# Patient Record
Sex: Male | Born: 2000 | Race: White | Hispanic: Yes | Marital: Single | Smoking: Never smoker
Health system: Southern US, Community
[De-identification: ages and names within clinical notes are randomized; demographics above are authoritative.]

---
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
## 2021-01-14 ENCOUNTER — Other Ambulatory Visit: Payer: Self-pay

## 2021-01-14 ENCOUNTER — Emergency Department (HOSPITAL_COMMUNITY)
Admission: EM | Admit: 2021-01-14 | Discharge: 2021-01-15 | Disposition: A | Discharge status: Home / Self Care | Payer: Self-pay | Attending: Emergency Medicine | Admitting: Emergency Medicine

## 2021-01-14 ENCOUNTER — Encounter (HOSPITAL_COMMUNITY): Payer: Self-pay | Admitting: Emergency Medicine

## 2021-01-14 ENCOUNTER — Emergency Department (HOSPITAL_COMMUNITY): Payer: Self-pay

## 2021-01-14 DIAGNOSIS — R109 Unspecified abdominal pain: Secondary | ICD-10-CM | POA: Insufficient documentation

## 2021-01-14 LAB — CBC WITH DIFFERENTIAL/PLATELET
Abs Immature Granulocytes: 0.02 10*3/uL (ref 0.00–0.07)
Basophils Absolute: 0 10*3/uL (ref 0.0–0.1)
Basophils Relative: 1 %
Eosinophils Absolute: 0.1 10*3/uL (ref 0.0–0.5)
Eosinophils Relative: 1 %
HCT: 49.3 % (ref 39.0–52.0)
Hemoglobin: 16.2 g/dL (ref 13.0–17.0)
Immature Granulocytes: 0 %
Lymphocytes Relative: 38 %
Lymphs Abs: 2.5 10*3/uL (ref 0.7–4.0)
MCH: 30.5 pg (ref 26.0–34.0)
MCHC: 32.9 g/dL (ref 30.0–36.0)
MCV: 92.8 fL (ref 80.0–100.0)
Monocytes Absolute: 0.5 10*3/uL (ref 0.1–1.0)
Monocytes Relative: 7 %
Neutro Abs: 3.5 10*3/uL (ref 1.7–7.7)
Neutrophils Relative %: 53 %
Platelets: 274 10*3/uL (ref 150–400)
RBC: 5.31 MIL/uL (ref 4.22–5.81)
RDW: 12.6 % (ref 11.5–15.5)
WBC: 6.6 10*3/uL (ref 4.0–10.5)
nRBC: 0 % (ref 0.0–0.2)

## 2021-01-14 LAB — BASIC METABOLIC PANEL
Anion gap: 8 (ref 5–15)
BUN: 11 mg/dL (ref 6–20)
CO2: 25 mmol/L (ref 22–32)
Calcium: 9.1 mg/dL (ref 8.9–10.3)
Chloride: 104 mmol/L (ref 98–111)
Creatinine, Ser: 1.1 mg/dL (ref 0.61–1.24)
GFR, Estimated: >60 mL/min (ref >60)
Glucose, Bld: 117 mg/dL — ABNORMAL HIGH (ref 70–99)
Potassium: 4 mmol/L (ref 3.5–5.1)
Sodium: 137 mmol/L (ref 135–145)

## 2021-01-14 NOTE — ED Provider Notes (Signed)
Emergency Medicine Provider Triage Evaluation Note  Kyle Brock , a 20 y.o. male  was evaluated in triage.  Pt complains of left flank pain.  Patient complains of left flank pain for the last few days.  This pain is intermittent.  Patient denies any alleviating or aggravating factors.  Patient denies any recent falls or injuries.  Review of Systems  Positive: Left flank pain Negative: Fever, chills, dysuria, hematuria, urinary frequency, abdominal pain, nausea, vomiting, testicular pain or swelling.  Physical Exam  BP (!) 146/103   Pulse 84   Temp 98.7 F (37.1 C) (Oral)   Resp 16   SpO2 97%  Gen:   Awake, no distress   Resp:  Normal effort  MSK:   Moves extremities without difficulty  Other:  No midline tenderness or deformity to cervical, thoracic, or lumbar spine.  Abdomen soft, nondistended, nontender.  Negative CVA tenderness bilaterally.  Medical Decision Making  Medically screening exam initiated at 4:44 PM.  Appropriate orders placed.  Jessup Ogas was informed that the remainder of the evaluation will be completed by another provider, this initial triage assessment does not replace that evaluation, and the importance of remaining in the ED until their evaluation is complete.  Will obtain urinalysis, urine culture, basic lab work.  Shared decision making with patient about obtaining noncontrast CT scan to evaluate for renal calculus or waiting for UA results, patient elects to have scan performed now.   Haskel Schroeder, PA-C 01/14/21 1646    Jacalyn Lefevre, MD 01/14/21 [phone]

## 2021-01-14 NOTE — ED Triage Notes (Signed)
Pt reports L flank pain x 1 week.  Denies nausea, vomiting, fever, chills, and urinary complaints.

## 2021-01-15 LAB — URINALYSIS, ROUTINE W REFLEX MICROSCOPIC
Bilirubin Urine: NEGATIVE
Glucose, UA: NEGATIVE mg/dL
Hgb urine dipstick: NEGATIVE
Ketones, ur: NEGATIVE mg/dL
Leukocytes,Ua: NEGATIVE
Nitrite: NEGATIVE
Protein, ur: NEGATIVE mg/dL
Specific Gravity, Urine: 1.024 (ref 1.005–1.030)
pH: 6 (ref 5.0–8.0)

## 2021-01-15 MED ORDER — OMEPRAZOLE 20 MG PO CPDR: 20.0000 mg | DELAYED_RELEASE_CAPSULE | Freq: Every day | ORAL | 0 refills | Status: DC

## 2021-01-15 NOTE — ED Provider Notes (Signed)
[facility] EMERGENCY DEPARTMENT Provider Note   CSN: [id number] Arrival date & time: 01/14/21  1623     History No chief complaint on file.   Kyle Brock is a 20 y.o. male.  Patient presents chief complaint of left upper quadrant pain.  Describes it as sharp and persistent worse in the last week.  He states the pain is worse especially when he eats something.  Otherwise denies any fevers or cough.  No vomiting or diarrhea.  No pain with urination or blood noticed in the urine.  Denies any fall or trauma.      History reviewed. No pertinent past medical history.  Patient Active Problem List   Diagnosis Date Noted   ECZEMA 12/28/2009   SKIN RASH 12/28/2009   HEART SOUNDS, ABNORMAL 12/28/2009    History reviewed. No pertinent surgical history.     No family history on file.  Social History   Tobacco Use   Smoking status: Never   Smokeless tobacco: Never  Substance Use Topics   Alcohol use: No   Drug use: Not Currently    Home Medications Prior to Admission medications   Medication Sig Start Date End Date Taking? Authorizing Provider  omeprazole (PRILOSEC) 20 MG capsule Take 1 capsule (20 mg total) by mouth daily for 20 days. 01/15/21 02/04/21 Yes Medford Staheli, Eustace Moore, MD  bismuth subsalicylate (PEPTO BISMOL) 262 MG/15ML suspension Take 15 mLs by mouth every 6 (six) hours as needed for indigestion.    [provider]  dicyclomine (BENTYL) 10 MG capsule Take 1 capsule (10 mg total) by mouth 2 (two) times daily as needed. For abdominal cramping 07/14/12   Ree Shay, MD  ibuprofen (ADVIL,MOTRIN) 100 MG/5ML suspension Take 29.4 mLs (588 mg total) by mouth every 6 (six) hours as needed for fever or mild pain. 11/28/13   Marcellina Millin, MD  ondansetron (ZOFRAN ODT) 4 MG disintegrating tablet Take 1 tablet (4 mg total) by mouth every 8 (eight) hours as needed for nausea. 07/14/12   Ree Shay, MD  ondansetron (ZOFRAN ODT) 4 MG disintegrating tablet  Take 1 tablet (4 mg total) by mouth every 8 (eight) hours as needed for nausea or vomiting. 05/15/16   Deatra Canter, FNP  ondansetron (ZOFRAN) 4 MG tablet Take 1 tablet (4 mg total) by mouth every 6 (six) hours. Prn n/v. 07/11/15   Linna Hoff, MD  predniSONE (STERAPRED UNI-PAK 21 TAB) 10 MG (21) TBPK tablet Take by mouth daily. Take 6 tabs by mouth daily  for 2 days, then 5 tabs for 2 days, then 4 tabs for 2 days, then 3 tabs for 2 days, 2 tabs for 2 days, then 1 tab by mouth daily for 2 days 02/18/17   Alysia Penna C, PA-C    Allergies    Patient has no known allergies.  Review of Systems   Review of Systems  Constitutional:  Negative for fever.  HENT:  Negative for ear pain and sore throat.   Eyes:  Negative for pain.  Respiratory:  Negative for cough.   Cardiovascular:  Negative for chest pain.  Gastrointestinal:  Negative for abdominal pain.  Genitourinary:  Positive for flank pain.  Musculoskeletal:  Negative for back pain.  Skin:  Negative for color change and rash.  Neurological:  Negative for syncope.  All other systems reviewed and are negative.  Physical Exam Updated Vital Signs BP (!) 142/99   Pulse 71   Temp 98.7 F (37.1 C) (Oral)  Resp 17   SpO2 99%   Physical Exam Constitutional:      Appearance: He is well-developed.  HENT:     Head: Normocephalic.     Nose: Nose normal.  Eyes:     Extraocular Movements: Extraocular movements intact.  Cardiovascular:     Rate and Rhythm: Normal rate.  Pulmonary:     Effort: Pulmonary effort is normal.  Abdominal:     Tenderness: There is no abdominal tenderness. There is no guarding or rebound.  Skin:    Coloration: Skin is not jaundiced.  Neurological:     Mental Status: He is alert. Mental status is at baseline.    ED Results / Procedures / Treatments   Labs (all labs ordered are listed, but only abnormal results are displayed) Labs Reviewed  BASIC METABOLIC PANEL - Abnormal; Notable for the following  components:      Result Value   Glucose, Bld 117 (*)    All other components within normal limits  URINE CULTURE  URINALYSIS, ROUTINE W REFLEX MICROSCOPIC  CBC WITH DIFFERENTIAL/PLATELET    EKG None  Radiology CT Renal Stone Study  Result Date: 01/14/2021 CLINICAL DATA:  Left flank pain. Patient reports symptoms for 1 week. EXAM: CT ABDOMEN AND PELVIS WITHOUT CONTRAST TECHNIQUE: Multidetector CT imaging of the abdomen and pelvis was performed following the standard protocol without IV contrast. COMPARISON:  None. FINDINGS: Lower chest: Minor subsegmental atelectasis in the right lower lobe. No confluent consolidation or pleural effusion. Hepatobiliary: Diffusely decreased hepatic density typical of steatosis. There is focal fatty sparing adjacent to the gallbladder fossa. No focal lesion is seen on this unenhanced exam. Partially distended gallbladder without calcified gallstone or pericholecystic inflammation. There is no biliary dilatation. Pancreas: Unremarkable. No pancreatic ductal dilatation or surrounding inflammatory changes. Spleen: Normal in size without focal abnormality. Adrenals/Urinary Tract: Normal adrenal glands. No hydronephrosis or renal calculi. No perinephric edema. 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 No ureteral or bladder stones. No focal renal lesions are seen. No bladder wall thickening. Stomach/Bowel: Ingested material within the stomach. No small bowel obstruction or inflammation. Normal appendix. Small volume of stool throughout the colon. No colonic inflammation. Vascular/Lymphatic: Normal caliber abdominal aorta. No portal venous or mesenteric gas. There are no enlarged lymph nodes in the abdomen or pelvis. Reproductive: Prostate is unremarkable. Other: No free air, free fluid, or intra-abdominal fluid collection. There is a small fat containing umbilical hernia. Musculoskeletal: There are no acute or suspicious  osseous abnormalities. Vague area of sclerosis in the left femoral head may represent avascular necrosis, only faintly visualized. IMPRESSION: 1. No renal stones or obstructive uropathy. No acute abnormality in the abdomen/pelvis. 2. Hepatic steatosis. 3. Small fat containing umbilical hernia. 4. Vague area of sclerosis in the left femoral head may represent avascular necrosis, only faintly visualized. Electronically Signed   By: Narda Rutherford M.D.   On: 01/14/2021 17:46    Procedures Procedures   Medications Ordered in ED Medications - No data to display  ED Course  I have reviewed the triage vital signs and the nursing notes.  Pertinent labs & imaging results that were available during my care of the patient were reviewed by me and considered in my medical decision making (see chart for details).    MDM Rules/Calculators/A&P                           Labs unremarkable white  count normal hemoglobin normal chemistry remarkable.  CT of the abdomen pelvis pursued with no abnormalities noted per radiologist.  Urinalysis unremarkable as well.  Serial abdominal exams are done, last was on time 9:20 AM, no guarding or tenderness noted.  On repeat evaluation patient states pain is resolved he no longer has any pain or discomfort and is feeling well.  Etiology is unclear, gastritis is considered, however no abnormal findings seen today.  Advising outpatient follow-up with his doctor within the week, advised immediate return for worsening pain fevers or any additional concerns.   Final Clinical Impression(s) / ED Diagnoses Final diagnoses:  Flank pain    Rx / DC Orders ED Discharge Orders          Ordered    omeprazole (PRILOSEC) 20 MG capsule  Daily        01/15/21 0931             Cheryll Cockayne, MD 01/15/21 [phone]

## 2021-01-15 NOTE — Discharge Instructions (Addendum)
Your blood test and CT imaging did not show any emergent findings.  Your urinalysis did not show any signs of infection.  Take the medications as written, follow-up with your doctor within the week.  Return to the ER if you have fevers or worsening symptoms

## 2021-01-16 LAB — URINE CULTURE: Culture: NO GROWTH

## 2021-02-09 ENCOUNTER — Other Ambulatory Visit: Payer: Self-pay

## 2021-02-09 ENCOUNTER — Encounter: Payer: Self-pay | Admitting: Family

## 2021-02-09 ENCOUNTER — Ambulatory Visit (INDEPENDENT_AMBULATORY_CARE_PROVIDER_SITE_OTHER): Payer: Self-pay | Admitting: Family

## 2021-02-09 VITALS — BP 130/88 | HR 80 | Temp 98.8°F | Resp 16 | Ht 68.5 in | Wt 214.8 lb

## 2021-02-09 DIAGNOSIS — K30 Functional dyspepsia: Secondary | ICD-10-CM

## 2021-02-09 DIAGNOSIS — Z7689 Persons encountering health services in other specified circumstances: Secondary | ICD-10-CM | POA: Insufficient documentation

## 2021-02-09 DIAGNOSIS — E669 Obesity, unspecified: Secondary | ICD-10-CM

## 2021-02-09 MED ORDER — OMEPRAZOLE 20 MG PO CPDR: 20.0000 mg | DELAYED_RELEASE_CAPSULE | Freq: Every day | ORAL | 0 refills | Status: DC

## 2021-02-09 NOTE — Progress Notes (Signed)
New Patient Office Visit  Subjective:  Patient ID: Kyle Brock, male    DOB: [birth year]  Age: 20 y.o. MRN: [id number]  CC:  Chief Complaint  Patient presents with   Establish Care    HPI Kyle Brock presents to establish care and discuss one problem.  GERD, Follow up: The patient was last seen for GERD 1 months ago in the ER for initial DX. Since that visit he was given Prilosec, and has been taking prn. He reports fair compliance with treatment. He is not having side effects. He IS experiencing fullness after meals, heartburn, midespigastric pain, nausea, symptoms primarily relate to meals, and lying down after meals, and upper abdominal discomfort. He is NOT experiencing bilious reflux, chest pain, cough, difficulty swallowing, hematemesis, or hoarseness  No past medical history on file.  No past surgical history on file.  Family History  Problem Relation Age of Onset   Kidney disease Father     Social History   Socioeconomic History   Marital status: Single    Spouse name: Not on file   Number of children: Not on file   Years of education: Not on file   Highest education level: Not on file  Occupational History   Not on file  Tobacco Use   Smoking status: Never   Smokeless tobacco: Never  Substance and Sexual Activity   Alcohol use: No   Drug use: Not Currently   Sexual activity: Not on file  Other Topics Concern   Not on file  Social History Narrative   Not on file   Social Determinants of Health   Financial Resource Strain: Not on file  Food Insecurity: Not on file  Transportation Needs: Not on file  Physical Activity: Not on file  Stress: Not on file  Social Connections: Not on file  Intimate Partner Violence: Not on file    Objective:   Today's Vitals: BP 130/88   Pulse 80   Temp 98.8 F (37.1 C)   Resp 16   Ht 5' 8.5" (1.74 m)   Wt 214 lb 12.8 oz (97.4 kg)   SpO2 97%   BMI 32.19 kg/m   Physical Exam Vitals and nursing note  reviewed.  Constitutional:      General: He is not in acute distress.    Appearance: Normal appearance. He is obese.  HENT:     Head: Normocephalic.  Cardiovascular:     Rate and Rhythm: Normal rate and regular rhythm.  Pulmonary:     Effort: Pulmonary effort is normal.     Breath sounds: Normal breath sounds.  Musculoskeletal:        General: Normal range of motion.     Cervical back: Normal range of motion.  Skin:    General: Skin is warm and dry.  Neurological:     Mental Status: He is alert and oriented to person, place, and time.  Psychiatric:        Mood and Affect: Mood normal.    Assessment & Plan:   Problem List Items Addressed This Visit       Other   Obesity (BMI 30-39.9) - Primary    Wt. Loss strategies reviewed including portion control, less carbs including sweets, eating most of calories earlier in day, drinking 64oz water qd, and establishing daily exercise routine.      Persistent indigestion    Seen in ER last month for same problem and given Prilosec, has been taking prn, advised to take daily, sending  new RX. Advised to avoid acidic foods.       Encounter to establish care    Outpatient Encounter Medications as of 02/09/2021  Medication Sig   omeprazole (PRILOSEC) 20 MG capsule Take 1 capsule (20 mg total) by mouth daily. Take daily for 2 weeks, then every other day.   [DISCONTINUED] bismuth subsalicylate (PEPTO BISMOL) 262 MG/15ML suspension Take 15 mLs by mouth every 6 (six) hours as needed for indigestion.   [DISCONTINUED] dicyclomine (BENTYL) 10 MG capsule Take 1 capsule (10 mg total) by mouth 2 (two) times daily as needed. For abdominal cramping   [DISCONTINUED] ibuprofen (ADVIL,MOTRIN) 100 MG/5ML suspension Take 29.4 mLs (588 mg total) by mouth every 6 (six) hours as needed for fever or mild pain.   [DISCONTINUED] omeprazole (PRILOSEC) 20 MG capsule Take 1 capsule (20 mg total) by mouth daily for 20 days.   [DISCONTINUED] ondansetron (ZOFRAN  ODT) 4 MG disintegrating tablet Take 1 tablet (4 mg total) by mouth every 8 (eight) hours as needed for nausea.   [DISCONTINUED] ondansetron (ZOFRAN ODT) 4 MG disintegrating tablet Take 1 tablet (4 mg total) by mouth every 8 (eight) hours as needed for nausea or vomiting.   [DISCONTINUED] ondansetron (ZOFRAN) 4 MG tablet Take 1 tablet (4 mg total) by mouth every 6 (six) hours. Prn n/v.   [DISCONTINUED] predniSONE (STERAPRED UNI-PAK 21 TAB) 10 MG (21) TBPK tablet Take by mouth daily. Take 6 tabs by mouth daily  for 2 days, then 5 tabs for 2 days, then 4 tabs for 2 days, then 3 tabs for 2 days, 2 tabs for 2 days, then 1 tab by mouth daily for 2 days   No facility-administered encounter medications on file as of 02/09/2021.    Follow-up: Return if symptoms worsen or fail to improve, for Complete physical w/fasting labs.   Dulce Sellar, NP

## 2021-02-09 NOTE — Assessment & Plan Note (Signed)
Wt. Loss strategies reviewed including portion control, less carbs including sweets, eating most of calories earlier in day, drinking 64oz water qd, and establishing daily exercise routine. °

## 2021-02-09 NOTE — Assessment & Plan Note (Signed)
Seen in ER last month for same problem and given Prilosec, has been taking prn, advised to take daily, sending new RX. Advised to avoid acidic foods.

## 2021-02-09 NOTE — Patient Instructions (Addendum)
Welcome to [facility]! It was a pleasure meeting you today.  As discussed, I have sent a refill on your medication to your pharmacy, remember to take this daily for the next 2 weeks, then stop or take every other day to control symptoms. If you still have symptoms you can take Pepto Bismol, Tums, or Rolaids as needed. See the attached handout to foods & drinks to avoid to prevent symptoms. Let me know if symptoms persist or worsen.  Please schedule a physical with fasting lab work today for the near future!  PLEASE NOTE:  If you had any LAB tests please let us know if you have not heard back within a few days. You may see your results on MyChart before we have a chance to review them but we will give you a call once they are reviewed by [place]. If we ordered any REFERRALS today, please let us know if you have not heard from their office within the next week.  Let us know through MyChart if you are needing REFILLS, or have your pharmacy send [place] the request. You can also use MyChart to communicate with me or any office staff.  Please try these tips to maintain a healthy lifestyle:  Eat most of your calories during the day when you are active. Eliminate processed foods including packaged sweets (pies, cakes, cookies), reduce intake of potatoes, white bread, white pasta, and white rice. Look for whole grain options, oat flour or almond flour.  Each meal should contain half fruits/vegetables, one quarter protein, and one quarter carbs (no bigger than a computer mouse).  Cut down on sweet beverages. This includes juice, soda, and sweet tea. Also watch fruit intake, though this is a healthier sweet option, it still contains natural sugar! Limit to 3 servings daily.  Drink at least 1 glass of water with each meal and aim for at least 8 glasses per day  Exercise at least 150 minutes every week.

## 2021-03-16 ENCOUNTER — Emergency Department (HOSPITAL_COMMUNITY)
Admission: EM | Admit: 2021-03-16 | Discharge: 2021-03-17 | Disposition: A | Discharge status: Home / Self Care | Payer: Self-pay | Attending: Emergency Medicine | Admitting: Emergency Medicine

## 2021-03-16 ENCOUNTER — Other Ambulatory Visit: Payer: Self-pay

## 2021-03-16 ENCOUNTER — Emergency Department (HOSPITAL_COMMUNITY): Payer: Self-pay

## 2021-03-16 DIAGNOSIS — J101 Influenza due to other identified influenza virus with other respiratory manifestations: Secondary | ICD-10-CM | POA: Insufficient documentation

## 2021-03-16 DIAGNOSIS — Z20822 Contact with and (suspected) exposure to covid-19: Secondary | ICD-10-CM | POA: Insufficient documentation

## 2021-03-16 DIAGNOSIS — R002 Palpitations: Secondary | ICD-10-CM | POA: Insufficient documentation

## 2021-03-16 LAB — CBC WITH DIFFERENTIAL/PLATELET
Abs Immature Granulocytes: 0.02 10*3/uL (ref 0.00–0.07)
Basophils Absolute: 0 10*3/uL (ref 0.0–0.1)
Basophils Relative: 0 %
Eosinophils Absolute: 0 10*3/uL (ref 0.0–0.5)
Eosinophils Relative: 0 %
HCT: 48.1 % (ref 39.0–52.0)
Hemoglobin: 16.7 g/dL (ref 13.0–17.0)
Immature Granulocytes: 0 %
Lymphocytes Relative: 7 %
Lymphs Abs: 0.6 10*3/uL — ABNORMAL LOW (ref 0.7–4.0)
MCH: 31.4 pg (ref 26.0–34.0)
MCHC: 34.7 g/dL (ref 30.0–36.0)
MCV: 90.4 fL (ref 80.0–100.0)
Monocytes Absolute: 1 10*3/uL (ref 0.1–1.0)
Monocytes Relative: 11 %
Neutro Abs: 7.2 10*3/uL (ref 1.7–7.7)
Neutrophils Relative %: 82 %
Platelets: 248 10*3/uL (ref 150–400)
RBC: 5.32 MIL/uL (ref 4.22–5.81)
RDW: 12.5 % (ref 11.5–15.5)
WBC: 8.9 10*3/uL (ref 4.0–10.5)
nRBC: 0 % (ref 0.0–0.2)

## 2021-03-16 LAB — BASIC METABOLIC PANEL
Anion gap: 10 (ref 5–15)
BUN: 10 mg/dL (ref 6–20)
CO2: 21 mmol/L — ABNORMAL LOW (ref 22–32)
Calcium: 8.9 mg/dL (ref 8.9–10.3)
Chloride: 106 mmol/L (ref 98–111)
Creatinine, Ser: 1.06 mg/dL (ref 0.61–1.24)
GFR, Estimated: >60 mL/min (ref >60)
Glucose, Bld: 113 mg/dL — ABNORMAL HIGH (ref 70–99)
Potassium: 4.3 mmol/L (ref 3.5–5.1)
Sodium: 137 mmol/L (ref 135–145)

## 2021-03-16 LAB — RESP PANEL BY RT-PCR (FLU A&B, COVID) ARPGX2
Influenza A by PCR: POSITIVE — AB
Influenza B by PCR: NEGATIVE
SARS Coronavirus 2 by RT PCR: NEGATIVE

## 2021-03-16 MED ORDER — OSELTAMIVIR PHOSPHATE 75 MG PO CAPS: 75.0000 mg | ORAL_CAPSULE | Freq: Two times a day (BID) | ORAL | 0 refills | Status: DC

## 2021-03-16 MED ORDER — LACTATED RINGERS IV BOLUS
1000.0000 mL | Freq: Once | INTRAVENOUS | Status: AC
  Administered 2021-03-16: 22:00:00 1000 mL via INTRAVENOUS

## 2021-03-16 MED ORDER — METOPROLOL TARTRATE 5 MG/5ML IV SOLN
5.0000 mg | Freq: Once | INTRAVENOUS | Status: AC
  Administered 2021-03-16: 22:00:00 5 mg via INTRAVENOUS
  Filled 2021-03-16: qty 5

## 2021-03-16 MED ORDER — ACETAMINOPHEN 500 MG PO TABS
1000.0000 mg | ORAL_TABLET | Freq: Once | ORAL | Status: AC
  Administered 2021-03-16: 22:00:00 1000 mg via ORAL
  Filled 2021-03-16: qty 2

## 2021-03-16 MED ORDER — IBUPROFEN 400 MG PO TABS
600.0000 mg | ORAL_TABLET | Freq: Once | ORAL | Status: AC
  Administered 2021-03-16: 21:00:00 600 mg via ORAL
  Filled 2021-03-16: qty 1

## 2021-03-16 MED ORDER — LACTATED RINGERS IV BOLUS
1000.0000 mL | Freq: Once | INTRAVENOUS | Status: AC
  Administered 2021-03-16: 21:00:00 1000 mL via INTRAVENOUS

## 2021-03-16 NOTE — Discharge Instructions (Addendum)
Please follow-up with your primary care doctor.  Can also follow-up with cardiology.  If you have any recurrent chest pain, palpitations or difficulty in breathing, please return to ER for reassessment.  Recommend taking course of Tamiflu.

## 2021-03-16 NOTE — ED Provider Notes (Signed)
[facility] EMERGENCY DEPARTMENT Provider Note   CSN: [id number] Arrival date & time: 03/16/21  2036     History Chief Complaint  Patient presents with   Palpitations    Kyle Brock is a 20 y.o. male.  Presents to ER with concern for palpitations.  Palpitations started a couple hours prior to arrival.  Had initially endorsed some chest discomfort, no difficulty in breathing.  He also has had fever today, chills and feeling generally unwell, cough.  Cough is nonproductive.  Contacted EMS, patient was thought to possibly have SVT and was given adenosine.  Patient states that he currently does not have any chest pain.  Continues to deny any difficulty in breathing.  He denies any family history of sudden cardiac death, fatal arrhythmia.  He denies any chronic medical problems.  HPI     Past Medical History:  Diagnosis Date   HEART SOUNDS, ABNORMAL 12/28/2009   Qualifier: Diagnosis of  By: Annamary Carolin MD, Amber     SKIN RASH 12/28/2009   Qualifier: Diagnosis of  By: [place], Bonnie      Patient Active Problem List   Diagnosis Date Noted   Obesity (BMI 30-39.9) 02/09/2021   Persistent indigestion 02/09/2021   Encounter to establish care 02/09/2021   ECZEMA 12/28/2009    No past surgical history on file.     Family History  Problem Relation Age of Onset   Kidney disease Father     Social History   Tobacco Use   Smoking status: Never   Smokeless tobacco: Never  Substance Use Topics   Alcohol use: No   Drug use: Not Currently    Home Medications Prior to Admission medications   Medication Sig Start Date End Date Taking? Authorizing Provider  oseltamivir (TAMIFLU) 75 MG capsule Take 1 capsule (75 mg total) by mouth every 12 (twelve) hours. 03/16/21  Yes Lucrezia Starch, MD  omeprazole (PRILOSEC) 20 MG capsule Take 1 capsule (20 mg total) by mouth daily. Take daily for 2 weeks, then every other day. Patient not taking: Reported on 03/16/2021  02/09/21   Jeanie Sewer, NP    Allergies    Patient has no known allergies.  Review of Systems   Review of Systems  Constitutional:  Positive for chills, fatigue and fever.  HENT:  Negative for ear pain and sore throat.   Eyes:  Negative for pain and visual disturbance.  Respiratory:  Negative for cough and shortness of breath.   Cardiovascular:  Negative for chest pain and palpitations.  Gastrointestinal:  Negative for abdominal pain and vomiting.  Genitourinary:  Negative for dysuria and hematuria.  Musculoskeletal:  Positive for arthralgias and myalgias. Negative for back pain.  Skin:  Negative for color change and rash.  Neurological:  Negative for seizures and syncope.  All other systems reviewed and are negative.  Physical Exam Updated Vital Signs BP (!) 131/96    Pulse (!) 114    Temp 100 F (37.8 C) (Oral)    Resp 17    Ht 5\' 8"  (1.727 m)    Wt 94 kg    SpO2 99%    BMI 31.51 kg/m   Physical Exam Vitals and nursing note reviewed.  Constitutional:      General: He is not in acute distress.    Appearance: He is well-developed.  HENT:     Head: Normocephalic and atraumatic.  Eyes:     Conjunctiva/sclera: Conjunctivae normal.  Cardiovascular:     Rate and Rhythm:  Regular rhythm. Tachycardia present.     Pulses: Normal pulses.  Pulmonary:     Effort: Pulmonary effort is normal. No respiratory distress.     Breath sounds: Normal breath sounds.  Abdominal:     Palpations: Abdomen is soft.     Tenderness: There is no abdominal tenderness.  Musculoskeletal:        General: No swelling, deformity or signs of injury.     Cervical back: Neck supple.  Skin:    General: Skin is warm and dry.     Capillary Refill: Capillary refill takes less than 2 seconds.  Neurological:     General: No focal deficit present.     Mental Status: He is alert and oriented to person, place, and time.  Psychiatric:        Mood and Affect: Mood normal.    ED Results / Procedures /  Treatments   Labs (all labs ordered are listed, but only abnormal results are displayed) Labs Reviewed  RESP PANEL BY RT-PCR (FLU A&B, COVID) ARPGX2 - Abnormal; Notable for the following components:      Result Value   Influenza A by PCR POSITIVE (*)    All other components within normal limits  CBC WITH DIFFERENTIAL/PLATELET - Abnormal; Notable for the following components:   Lymphs Abs 0.6 (*)    All other components within normal limits  BASIC METABOLIC PANEL - Abnormal; Notable for the following components:   CO2 21 (*)    Glucose, Bld 113 (*)    All other components within normal limits    EKG EKG Interpretation  Date/Time:  Friday March 16 2021 22:33:28 EST Ventricular Rate:  94 PR Interval:  142 QRS Duration: 88 QT Interval:  308 QTC Calculation: 385 R Axis:   92 Text Interpretation: Normal sinus rhythm Rightward axis Nonspecific T wave abnormality Confirmed by Marianna Fuss ([id number]) on 03/16/2021 10:38:59 PM  Radiology DG Chest Portable 1 View  Result Date: 03/16/2021 CLINICAL DATA:  Shortness of breath EXAM: PORTABLE CHEST 1 VIEW COMPARISON:  None. FINDINGS: The heart size and mediastinal contours are within normal limits. Both lungs are clear. The visualized skeletal structures are unremarkable. IMPRESSION: No active disease. Electronically Signed   By: Alcide Clever M.D.   On: 03/16/2021 22:32    Procedures Procedures   Medications Ordered in ED Medications  ibuprofen (ADVIL) tablet 600 mg (600 mg Oral Given 03/16/21 2059)  lactated ringers bolus 1,000 mL (0 mLs Intravenous Stopped 03/16/21 2220)  acetaminophen (TYLENOL) tablet 1,000 mg (1,000 mg Oral Given 03/16/21 2228)  lactated ringers bolus 1,000 mL (0 mLs Intravenous Stopped 03/16/21 2346)  metoprolol tartrate (LOPRESSOR) injection 5 mg (5 mg Intravenous Given 03/16/21 2228)    ED Course  I have reviewed the triage vital signs and the nursing notes.  Pertinent labs & imaging results that were  available during my care of the patient were reviewed by me and considered in my medical decision making (see chart for details).    MDM Rules/Calculators/A&P                         20 year old male previously healthy presenting to ER with concern for palpitations.  EMS evaluated patient initially in was thought to possibly be in SVT, given dose of adenosine.  Per review of EMS EKG, appears to be sinus tachycardia.  Do not see evidence for actual episode of SVT.  On arrival to ER initial heart rate 130s, febrile to  103.  Flu positive.  Suspect symptoms today are related to his underlying flu diagnosis.  Provided antipyretic, fluids and monitor patient.  On reassessment and all of his symptoms have resolved and he appears well no distress.  Denies any ongoing chest discomfort and has no difficulty breathing.  His basic labs are stable.  EKG does not have any obvious ischemic change and given patient's age, lack of risk factors or family history, very low suspicion for ACS.  No hypoxia or tachypnea, no complaints of difficulty breathing, doubt PE.  Will give Rx for Tamiflu, reviewed return precautions and discharged home.  Recommend close recheck with primary doctor, additionally provided information for cardiology given the degree of the sinus tachycardia that was present earlier.   Given his current clinical appearance believe he is stable for dc home.     After the discussed management above, the patient was determined to be safe for discharge.  The patient was in agreement with this plan and all questions regarding their care were answered.  ED return precautions were discussed and the patient will return to the ED with any significant worsening of condition.   Final Clinical Impression(s) / ED Diagnoses Final diagnoses:  Palpitations  Influenza A    Rx / DC Orders ED Discharge Orders          Ordered    oseltamivir (TAMIFLU) 75 MG capsule  Every 12 hours        03/16/21 2346              Lucrezia Starch, MD 03/16/21 2353

## 2021-03-16 NOTE — ED Notes (Signed)
Pt discharged and ambulated out of the ED without difficulty. 

## 2021-03-16 NOTE — ED Triage Notes (Signed)
Pt brought in by EMS for heart palpitations that started 2 hours ago. Pt also reports chest pain. Pt was given 6 mg adenosine, 324 aspirin, and 4 mg zofran by EMS. Pt's heart rate in the 130s at this time.

## 2021-05-26 ENCOUNTER — Ambulatory Visit (HOSPITAL_COMMUNITY)
Admission: EM | Admit: 2021-05-26 | Discharge: 2021-05-26 | Disposition: A | Discharge status: Home / Self Care | Payer: Self-pay | Attending: Emergency Medicine | Admitting: Emergency Medicine

## 2021-05-26 ENCOUNTER — Encounter (HOSPITAL_COMMUNITY): Payer: Self-pay | Admitting: *Deleted

## 2021-05-26 ENCOUNTER — Other Ambulatory Visit: Payer: Self-pay

## 2021-05-26 DIAGNOSIS — Z23 Encounter for immunization: Secondary | ICD-10-CM

## 2021-05-26 DIAGNOSIS — I10 Essential (primary) hypertension: Secondary | ICD-10-CM

## 2021-05-26 DIAGNOSIS — S60459A Superficial foreign body of unspecified finger, initial encounter: Secondary | ICD-10-CM

## 2021-05-26 DIAGNOSIS — L03011 Cellulitis of right finger: Secondary | ICD-10-CM

## 2021-05-26 MED ORDER — CEFTRIAXONE SODIUM 1 G IJ SOLR
1.0000 g | Freq: Once | INTRAMUSCULAR | Status: AC
  Administered 2021-05-26: 1 g via INTRAMUSCULAR

## 2021-05-26 MED ORDER — LIDOCAINE HCL (PF) 1 % IJ SOLN
INTRAMUSCULAR | Status: AC
  Filled 2021-05-26: qty 2

## 2021-05-26 MED ORDER — CEFTRIAXONE SODIUM 1 G IJ SOLR
INTRAMUSCULAR | Status: AC
  Filled 2021-05-26: qty 10

## 2021-05-26 MED ORDER — TETANUS-DIPHTH-ACELL PERTUSSIS 5-2.5-18.5 LF-MCG/0.5 IM SUSY
PREFILLED_SYRINGE | INTRAMUSCULAR | Status: AC
  Filled 2021-05-26: qty 0.5

## 2021-05-26 MED ORDER — TETANUS-DIPHTH-ACELL PERTUSSIS 5-2.5-18.5 LF-MCG/0.5 IM SUSY
0.5000 mL | PREFILLED_SYRINGE | Freq: Once | INTRAMUSCULAR | Status: AC
  Administered 2021-05-26: 0.5 mL via INTRAMUSCULAR

## 2021-05-26 MED ORDER — SULFAMETHOXAZOLE-TRIMETHOPRIM 800-160 MG PO TABS
1.0000 | ORAL_TABLET | Freq: Two times a day (BID) | ORAL | 0 refills | Status: AC
Start: 2021-05-26 — End: 2021-05-31

## 2021-05-26 NOTE — ED Triage Notes (Signed)
Pt reports he may have had a cut on Rt small finger last WED. On Thursday swelling to finger started. ?

## 2021-05-26 NOTE — ED Provider Notes (Signed)
[facility]    CSN: [id number] Arrival date & time: 05/26/21  1438    HISTORY   Chief Complaint  Patient presents with   Finger Injury   HPI Kyle Brock is a 21 y.o. male. Patient presents to the emergency room complaining of red, hot, swollen right small finger possibly due to a small cut that occurred 4 days ago.  Patient states the swelling began the day after he noticed the cut.  Patient states he was carrying some wooden kitchen cabinets and thinks he may have gotten a splinter into his finger, states there is a little black spot, or there was before his finger started swelling.  Patient does not recall when he last had a tetanus shot.  Patient states the pain and swelling is starting to radiate up his right forearm.  Patient denies fever, aches, chills, nausea, vomiting, diarrhea.  Patient does have mildly elevated temperature on arrival but this is not a new finding.  The history is provided by the patient.  Past Medical History:  Diagnosis Date   HEART SOUNDS, ABNORMAL 12/28/2009   Qualifier: Diagnosis of  By: Clotilde Dieter MD, Amber     SKIN RASH 12/28/2009   Qualifier: Diagnosis of  By: [place], Bonnie     Patient Active Problem List   Diagnosis Date Noted   Obesity (BMI 30-39.9) 02/09/2021   Persistent indigestion 02/09/2021   Encounter to establish care 02/09/2021   ECZEMA 12/28/2009   History reviewed. No pertinent surgical history.  Home Medications    Prior to Admission medications   Not on File    Family History Family History  Problem Relation Age of Onset   Kidney disease Father    Social History Social History   Tobacco Use   Smoking status: Never   Smokeless tobacco: Never  Substance Use Topics   Alcohol use: No   Drug use: Not Currently   Allergies   Patient has no known allergies.  Review of Systems Review of Systems Pertinent findings noted in history of present illness.   Physical Exam Triage Vital Signs ED Triage Vitals   Enc Vitals Group     BP 01/19/21 0827 (!) 147/82     Pulse Rate 01/19/21 0827 72     Resp 01/19/21 0827 18     Temp 01/19/21 0827 98.3 F (36.8 C)     Temp Source 01/19/21 0827 Oral     SpO2 01/19/21 0827 98 %     Weight --      Height --      Head Circumference --      Peak Flow --      Pain Score 01/19/21 0826 5     Pain Loc --      Pain Edu? --      Excl. in GC? --   No data found.  Updated Vital Signs BP (!) 139/92    Pulse 66    Temp 98.8 F (37.1 C)    Resp 16    SpO2 98%   Physical Exam Vitals and nursing note reviewed.  Constitutional:      General: He is not in acute distress.    Appearance: Normal appearance. He is not ill-appearing.  HENT:     Head: Normocephalic and atraumatic.  Eyes:     General: Lids are normal.        Right eye: No discharge.        Left eye: No discharge.     Extraocular Movements:  Extraocular movements intact.     Conjunctiva/sclera: Conjunctivae normal.     Right eye: Right conjunctiva is not injected.     Left eye: Left conjunctiva is not injected.  Neck:     Trachea: Trachea and phonation normal.  Cardiovascular:     Rate and Rhythm: Normal rate and regular rhythm.     Pulses: Normal pulses.     Heart sounds: Normal heart sounds. No murmur heard.   No friction rub. No gallop.  Pulmonary:     Effort: Pulmonary effort is normal. No accessory muscle usage, prolonged expiration or respiratory distress.     Breath sounds: Normal breath sounds. No stridor, decreased air movement or transmitted upper airway sounds. No decreased breath sounds, wheezing, rhonchi or rales.  Chest:     Chest wall: No tenderness.  Musculoskeletal:        General: Swelling (Erythema right fifth digit is diffusely tender to palpation as well.) and tenderness present. Normal range of motion.     Cervical back: Normal range of motion and neck supple. Normal range of motion.  Lymphadenopathy:     Cervical: No cervical adenopathy.  Skin:    General: Skin is  warm and dry.     Findings: No erythema or rash.  Neurological:     General: No focal deficit present.     Mental Status: He is alert and oriented to person, place, and time.  Psychiatric:        Mood and Affect: Mood normal.        Behavior: Behavior normal.    Visual Acuity Right Eye Distance:   Left Eye Distance:   Bilateral Distance:    Right Eye Near:   Left Eye Near:    Bilateral Near:     UC Couse / Diagnostics / Procedures:    EKG  Radiology No results found.  Procedures Procedures (including critical care time)  UC Diagnoses / Final Clinical Impressions(s)   I have reviewed the triage vital signs and the nursing notes.  Pertinent labs & imaging results that were available during my care of the patient were reviewed by me and considered in my medical decision making (see chart for details).    Final diagnoses:  Cellulitis of right little finger  Wood splinter in finger  Essential hypertension   Patient provided with Boostrix.  Patient provided with ceftriaxone injection provided relief of significant swelling and infection in right fifth finger.  Patient also advised to begin 5-day course of Bactrim, prescription sent to pharmacy.    Patient advised to follow-up with PCP regarding essential hypertension  Return precautions advised.  ED Prescriptions     Medication Sig Dispense Auth. Provider   sulfamethoxazole-trimethoprim (BACTRIM DS) 800-160 MG tablet Take 1 tablet by mouth 2 (two) times daily for 5 days. 10 tablet Theadora Rama Scales, PA-C      PDMP not reviewed this encounter.  Pending results:  Labs Reviewed - No data to display  Medications Ordered in UC: Medications  cefTRIAXone (ROCEPHIN) injection 1 g (has no administration in time range)  Tdap (BOOSTRIX) injection 0.5 mL (has no administration in time range)    Disposition Upon Discharge:  Condition: stable for discharge home Home: take medications as prescribed; routine  discharge instructions as discussed; follow up as advised.  Patient presented with an acute illness with associated systemic symptoms and significant discomfort requiring urgent management. In my opinion, this is a condition that a prudent lay person (someone who possesses an average knowledge  of health and medicine) may potentially expect to result in complications if not addressed urgently such as respiratory distress, impairment of bodily function or dysfunction of bodily organs.   Routine symptom specific, illness specific and/or disease specific instructions were discussed with the patient and/or caregiver at length.   As such, the patient has been evaluated and assessed, work-up was performed and treatment was provided in alignment with urgent care protocols and evidence based medicine.  Patient/parent/caregiver has been advised that the patient may require follow up for further testing and treatment if the symptoms continue in spite of treatment, as clinically indicated and appropriate.  If the patient was tested for COVID-19, Influenza and/or RSV, then the patient/parent/guardian was advised to isolate at home pending the results of his/her diagnostic coronavirus test and potentially longer if theyre positive. I have also advised pt that if his/her COVID-19 test returns positive, it's recommended to self-isolate for at least 10 days after symptoms first appeared AND until fever-free for 24 hours without fever reducer AND other symptoms have improved or resolved. Discussed self-isolation recommendations as well as instructions for household member/close contacts as per the [facility] and [place] DHHS, and also gave patient the COVID packet with this information.  Patient/parent/caregiver has been advised [facility] in 3-5 days if no better; to PCP or the Emergency Department if new signs and symptoms develop, or if the current signs or symptoms continue to change or worsen for further workup,  evaluation and treatment as clinically indicated and appropriate  The patient will follow up with their current PCP if and as advised. If the patient does not currently have a PCP we will assist them in obtaining one.   The patient may need specialty follow up if the symptoms continue, in spite of conservative treatment and management, for further workup, evaluation, consultation and treatment as clinically indicated and appropriate.   Patient/parent/caregiver verbalized understanding and agreement of plan as discussed.  All questions were addressed during visit.  Please see discharge instructions below for further details of plan.  Discharge Instructions:   Discharge Instructions      Please follow-up with your primary care provider regarding your blood pressure.  This is the second time your blood pressure has been elevated in the past 2 months during a medical visit.  Control blood pressure can cause long-term issues in his best treated early to avoid unwanted outcomes such as heart attack, stroke and kidney failure.  For your infection in your right little finger, you received an injection of an antibiotic called ceftriaxone which should rapidly relieve most of the swelling and tenderness in your finger in the next 12 to 24 hours.    Beginning this evening, please begin taking Bactrim, 1 tablet twice daily for the next 5 days.  Take your first dose tonight, your second dose tomorrow morning, third dose tomorrow evening and so on.  If you do not have significant relief of the pain and swelling in your finger, please report to the emergency room for further evaluation and treatment.  You may require stronger antibiotics.  I have enclosed some patient education regarding hypertension, cellulitis and splinters for your information.  Thank you for visiting urgent care today.  Please let us know if there is anything else we can do to help.      This office note has been dictated using  Teaching laboratory technicianDragon speech recognition software.  Unfortunately, and despite my best efforts, this method of dictation can sometimes lead  to occasional typographical or grammatical errors.  I apologize in advance if this occurs.     Theadora Rama Scales, PA-C 05/26/21 1523

## 2021-05-26 NOTE — Discharge Instructions (Addendum)
Please follow-up with your primary care provider regarding your blood pressure.  This is the second time your blood pressure has been elevated in the past 2 months during a medical visit.  Control blood pressure can cause long-term issues in his best treated early to avoid unwanted outcomes such as heart attack, stroke and kidney failure. ? ?For your infection in your right little finger, you received an injection of an antibiotic called ceftriaxone which should rapidly relieve most of the swelling and tenderness in your finger in the next 12 to 24 hours.   ? ?Beginning this evening, please begin taking Bactrim, 1 tablet twice daily for the next 5 days.  Take your first dose tonight, your second dose tomorrow morning, third dose tomorrow evening and so on. ? ?If you do not have significant relief of the pain and swelling in your finger, please report to the emergency room for further evaluation and treatment.  You may require stronger antibiotics. ? ?I have enclosed some patient education regarding hypertension, cellulitis and splinters for your information. ? ?Thank you for visiting urgent care today.  Please let us know if there is anything else we can do to help. ? ? ?

## 2021-12-13 ENCOUNTER — Encounter: Payer: Self-pay | Admitting: Family

## 2021-12-17 ENCOUNTER — Encounter: Payer: Self-pay | Admitting: *Deleted

## 2021-12-25 ENCOUNTER — Encounter: Payer: Self-pay | Admitting: Family

## 2021-12-26 ENCOUNTER — Ambulatory Visit (INDEPENDENT_AMBULATORY_CARE_PROVIDER_SITE_OTHER): Payer: Self-pay

## 2021-12-26 ENCOUNTER — Ambulatory Visit (HOSPITAL_COMMUNITY)
Admission: EM | Admit: 2021-12-26 | Discharge: 2021-12-26 | Disposition: A | Discharge status: Home / Self Care | Payer: Self-pay | Attending: Internal Medicine | Admitting: Internal Medicine

## 2021-12-26 ENCOUNTER — Encounter (HOSPITAL_COMMUNITY): Payer: Self-pay

## 2021-12-26 DIAGNOSIS — M7051 Other bursitis of knee, right knee: Secondary | ICD-10-CM

## 2021-12-26 DIAGNOSIS — M79604 Pain in right leg: Secondary | ICD-10-CM

## 2021-12-26 NOTE — ED Triage Notes (Signed)
Swelling in the right leg since after work Saturday afternoon. Patient states started with leg pain Saturday and then swelling Sunday morning. No history of leg edema. No known injuries or bites. No medications or diet changes. No change or increase in physical activity.  Pain and swelling in the right knee and calf.

## 2021-12-26 NOTE — ED Provider Notes (Signed)
[facility]   [id number] 12/26/21 Arrival Time: 0935  ASSESSMENT & PLAN:  1. Infrapatellar bursitis of right knee    -History, exam, imaging (point-of-care ultrasound and x-ray) are consistent with infrapatellar bursitis.  He was placed in a Ace wrap today for compression.  Recommended RICE and oral NSAIDs as needed.  Activities as tolerated.  Work note provided.  All questions answered.  No orders of the defined types were placed in this encounter.  Discharge Instructions   None       Reviewed expectations re: course of current medical issues. Questions answered. Outlined signs and symptoms indicating need for more acute intervention. Patient verbalized understanding. After Visit Summary given.   SUBJECTIVE: Pleasant 72 male comes to clinic for evaluation of right knee pain.  Pains been present for about 4 days now.  He was at work on Saturday and when driving home he noticed there is pain and swelling in the knee.  He said he mostly was working on a ladder denies any excessive time spent on his knees.  The swelling is continued over the last several days.  No known inciting trauma or injuries or bites.  No medications or diet changes.  No change or increase in physical activity.  Denies any fevers, chills, sweats, chest pain, shortness of breath.  No LMP for male patient. History reviewed. No pertinent surgical history.   OBJECTIVE:  Vitals:   12/26/21 1023  BP: (!) 130/90  Pulse: 62  Resp: 16  Temp: 98.1 F (36.7 C)  TempSrc: Oral  SpO2: 96%     Physical Exam Vitals reviewed.  Constitutional:      Appearance: He is not ill-appearing.  HENT:     Head: Normocephalic.  Cardiovascular:     Rate and Rhythm: Normal rate.  Pulmonary:     Effort: Pulmonary effort is normal.  Musculoskeletal:        General: Normal range of motion.     Comments: R Knee -there is an obvious infrapatellar effusion.  The knee is warm to the touch.  There is no overlying  erythema.  He is mildly tender to palpation at the medial and lateral joint lines.  He has full range of motion in flexion extension.  5/5 strength.  No ligamentous instability with valgus or varus stressing.  Negative Lachman.  Negative Thessaly test.  Point-of-care ultrasound: There is no joint effusion.  Quadriceps tendon is intact.  There is some soft tissue thickening and hypoechoic changes superficial to the patellar tendon in between the patella and the tibial tuberosity.  No obvious fluid collection.    Neurological:     Mental Status: He is alert.      Labs: Results for orders placed or performed during the hospital encounter of 03/16/21  Resp Panel by RT-PCR (Flu A&B, Covid) Nasopharyngeal Swab   Specimen: Nasopharyngeal Swab; Nasopharyngeal(NP) swabs in vial transport medium  Result Value Ref Range   SARS Coronavirus 2 by RT PCR NEGATIVE NEGATIVE   Influenza A by PCR POSITIVE (A) NEGATIVE   Influenza B by PCR NEGATIVE NEGATIVE  CBC with Differential  Result Value Ref Range   WBC 8.9 4.0 - 10.5 K/uL   RBC 5.32 4.22 - 5.81 MIL/uL   Hemoglobin 16.7 13.0 - 17.0 g/dL   HCT 40.9 81.1 - 91.4 %   MCV 90.4 80.0 - 100.0 fL   MCH 31.4 26.0 - 34.0 pg   MCHC 34.7 30.0 - 36.0 g/dL   RDW 78.2 95.6 - 21.3 %  Platelets 248 150 - 400 K/uL   nRBC 0.0 0.0 - 0.2 %   Neutrophils Relative % 82 %   Neutro Abs 7.2 1.7 - 7.7 K/uL   Lymphocytes Relative 7 %   Lymphs Abs 0.6 (L) 0.7 - 4.0 K/uL   Monocytes Relative 11 %   Monocytes Absolute 1.0 0.1 - 1.0 K/uL   Eosinophils Relative 0 %   Eosinophils Absolute 0.0 0.0 - 0.5 K/uL   Basophils Relative 0 %   Basophils Absolute 0.0 0.0 - 0.1 K/uL   Immature Granulocytes 0 %   Abs Immature Granulocytes 0.02 0.00 - 0.07 K/uL  Basic metabolic panel  Result Value Ref Range   Sodium 137 135 - 145 mmol/L   Potassium 4.3 3.5 - 5.1 mmol/L   Chloride 106 98 - 111 mmol/L   CO2 21 (L) 22 - 32 mmol/L   Glucose, Bld 113 (H) 70 - 99 mg/dL   BUN 10 6 -  20 mg/dL   Creatinine, Ser 1.06 0.61 - 1.24 mg/dL   Calcium 8.9 8.9 - 10.3 mg/dL   GFR, Estimated >60 >60 mL/min   Anion gap 10 5 - 15   Labs Reviewed - No data to display  Imaging: DG Knee Complete 4 Views Right  Result Date: 12/26/2021 CLINICAL DATA:  Pain and swelling in the right leg after work Saturday. EXAM: RIGHT KNEE - COMPLETE 4+ VIEW COMPARISON:  None Available. FINDINGS: There is no acute fracture or dislocation. Knee alignment is normal. The joint spaces are preserved. There is no erosive change. The soft tissues are unremarkable. There is no effusion. IMPRESSION: Normal knee radiographs. Electronically Signed   By: Valetta Mole M.D.   On: 12/26/2021 11:32     No Known Allergies                                             Past Medical History:  Diagnosis Date   HEART SOUNDS, ABNORMAL 12/28/2009   Qualifier: Diagnosis of  By: Annamary Carolin MD, Amber     SKIN RASH 12/28/2009   Qualifier: Diagnosis of  By: Martinique, Bonnie      Social History   Socioeconomic History   Marital status: Single    Spouse name: Not on file   Number of children: Not on file   Years of education: Not on file   Highest education level: Not on file  Occupational History   Not on file  Tobacco Use   Smoking status: Never   Smokeless tobacco: Never  Substance and Sexual Activity   Alcohol use: No   Drug use: Not Currently   Sexual activity: Not on file  Other Topics Concern   Not on file  Social History Narrative   Not on file   Social Determinants of Health   Financial Resource Strain: Not on file  Food Insecurity: Not on file  Transportation Needs: Not on file  Physical Activity: Not on file  Stress: Not on file  Social Connections: Not on file  Intimate Partner Violence: Not on file    Family History  Problem Relation Age of Onset   Kidney disease Father       Dortha Kern, MD 12/26/21 1207

## 2022-03-07 ENCOUNTER — Encounter: Payer: Self-pay | Admitting: *Deleted

## 2022-04-16 ENCOUNTER — Encounter (HOSPITAL_COMMUNITY): Payer: Self-pay

## 2022-04-16 ENCOUNTER — Ambulatory Visit (HOSPITAL_COMMUNITY)
Admission: EM | Admit: 2022-04-16 | Discharge: 2022-04-16 | Disposition: A | Discharge status: Home / Self Care | Payer: Self-pay | Attending: Emergency Medicine | Admitting: Emergency Medicine

## 2022-04-16 DIAGNOSIS — J069 Acute upper respiratory infection, unspecified: Secondary | ICD-10-CM

## 2022-04-16 MED ORDER — DEXTROMETHORPHAN POLISTIREX ER 30 MG/5ML PO SUER: 60.0000 mg | Freq: Two times a day (BID) | ORAL | 0 refills | Status: AC

## 2022-04-16 MED ORDER — CETIRIZINE HCL 10 MG PO TABS: 10.0000 mg | ORAL_TABLET | Freq: Every day | ORAL | 2 refills | Status: AC

## 2022-04-16 NOTE — ED Provider Notes (Signed)
[place]    CSN: [id number] Arrival date & time: 04/16/22  1152     History   Chief Complaint Chief Complaint  Patient presents with   Fever   Cough   Emesis    HPI Kyle Brock is a 22 y.o. male.  4 day history of runny nose, dry cough, fevers, emesis Tmax 104, comes and goes, responds to nyquil Reports a few episodes of emesis, mostly post-tussive but once after eating this morning Denies current nausea or abdominal pain. No diarrhea. Drinking lots of fluids, eating normally, good appetite   Has been using nyquil honey without relief of cough No other medications No known sick contacts  Past Medical History:  Diagnosis Date   HEART SOUNDS, ABNORMAL 12/28/2009   Qualifier: Diagnosis of  By: Annamary Carolin MD, Amber     SKIN RASH 12/28/2009   Qualifier: Diagnosis of  By: Martinique, Bonnie      Patient Active Problem List   Diagnosis Date Noted   Obesity (BMI 30-39.9) 02/09/2021   Persistent indigestion 02/09/2021   Encounter to establish care 02/09/2021   ECZEMA 12/28/2009    History reviewed. No pertinent surgical history.   Home Medications    Prior to Admission medications   Medication Sig Start Date End Date Taking? Authorizing Provider  cetirizine (ZYRTEC ALLERGY) 10 MG tablet Take 1 tablet (10 mg total) by mouth daily. 04/16/22  Yes Elfego Giammarino, Wells Guiles, PA-C  dextromethorphan (DELSYM) 30 MG/5ML liquid Take 10 mLs (60 mg total) by mouth 2 (two) times daily. 04/16/22  Yes Francy Mcilvaine, Wells Guiles, PA-C    Family History Family History  Problem Relation Age of Onset   Kidney disease Father     Social History Social History   Tobacco Use   Smoking status: Never   Smokeless tobacco: Never  Vaping Use   Vaping Use: Never used  Substance Use Topics   Alcohol use: No   Drug use: Not Currently     Allergies   Patient has no known allergies.   Review of Systems Review of Systems As per HPI  Physical Exam Triage Vital Signs ED Triage Vitals   Enc Vitals Group     BP 04/16/22 1442 (!) 152/87     Pulse Rate 04/16/22 1442 (!) 103     Resp 04/16/22 1442 18     Temp 04/16/22 1442 99.5 F (37.5 C)     Temp Source 04/16/22 1442 Oral     SpO2 04/16/22 1442 95 %     Weight --      Height --      Head Circumference --      Peak Flow --      Pain Score 04/16/22 1445 0     Pain Loc --      Pain Edu? --      Excl. in [place]? --    No data found.  Updated Vital Signs BP (!) 152/87 (BP Location: Right Arm)   Pulse (!) 103   Temp 99.5 F (37.5 C) (Oral)   Resp 18   SpO2 95%   Physical Exam Vitals and nursing note reviewed.  Constitutional:      General: He is not in acute distress. HENT:     Right Ear: Tympanic membrane and ear canal normal.     Left Ear: Tympanic membrane and ear canal normal.     Nose: No congestion or rhinorrhea.     Mouth/Throat:     Mouth: Mucous membranes are moist.  Pharynx: Oropharynx is clear. No posterior oropharyngeal erythema.  Eyes:     Conjunctiva/sclera: Conjunctivae normal.  Cardiovascular:     Rate and Rhythm: Normal rate and regular rhythm.     Pulses: Normal pulses.     Heart sounds: Normal heart sounds.  Pulmonary:     Effort: Pulmonary effort is normal.     Breath sounds: Normal breath sounds.     Comments: Occasional dry cough in clinic Abdominal:     General: There is no distension.     Tenderness: There is no abdominal tenderness. There is no guarding.  Musculoskeletal:        General: Normal range of motion.     Cervical back: Normal range of motion.  Lymphadenopathy:     Cervical: No cervical adenopathy.  Skin:    General: Skin is warm and dry.  Neurological:     Mental Status: He is alert and oriented to person, place, and time.     UC Treatments / Results  Labs (all labs ordered are listed, but only abnormal results are displayed) Labs Reviewed - No data to display  EKG  Radiology No results found.  Procedures Procedures   Medications Ordered in  UC Medications - No data to display  Initial Impression / Assessment and Plan / UC Course  I have reviewed the triage vital signs and the nursing notes.  Pertinent labs & imaging results that were available during my care of the patient were reviewed by me and considered in my medical decision making (see chart for details).  Afebrile here, last nyquil use was this morning  Discussed viral etiology Out of flu antiviral window, defer testing Recommend symptomatic care; delsym for cough, zyrtec for runny nose, fluids, honey, cough drops Patient with no questions at this time Return precautions discussed. Patient agrees to plan  Final Clinical Impressions(s) / UC Diagnoses   Final diagnoses:  Viral URI with cough     Discharge Instructions      I recommend the delsym cough syrup twice daily, in combination with the once daily zyrtec. Continue lots of fluids. Try honey and cough drops as well.  Symptoms should improve over the next several days. Please return if needed.    ED Prescriptions     Medication Sig Dispense Auth. Provider   dextromethorphan (DELSYM) 30 MG/5ML liquid Take 10 mLs (60 mg total) by mouth 2 (two) times daily. 89 mL Danica Camarena, PA-C   cetirizine (ZYRTEC ALLERGY) 10 MG tablet Take 1 tablet (10 mg total) by mouth daily. 30 tablet Maxwel Meadowcroft, Wells Guiles, PA-C      PDMP not reviewed this encounter.   Makisha Marrin, Wells Guiles, [place] 04/16/22 1525

## 2022-04-16 NOTE — ED Triage Notes (Signed)
Patient c/o intermittent fever, emesis and a non productive cough x 4 days.  Patient reports that he has been taking Nyquil with Honey with no relief.

## 2022-04-16 NOTE — Discharge Instructions (Signed)
I recommend the delsym cough syrup twice daily, in combination with the once daily zyrtec. Continue lots of fluids. Try honey and cough drops as well.  Symptoms should improve over the next several days. Please return if needed.

## 2022-05-23 ENCOUNTER — Encounter (HOSPITAL_COMMUNITY): Payer: Self-pay | Admitting: *Deleted

## 2022-05-23 ENCOUNTER — Ambulatory Visit (HOSPITAL_COMMUNITY)
Admission: EM | Admit: 2022-05-23 | Discharge: 2022-05-23 | Disposition: A | Discharge status: Home / Self Care | Payer: Self-pay | Attending: Family Medicine | Admitting: Family Medicine

## 2022-05-23 DIAGNOSIS — R197 Diarrhea, unspecified: Secondary | ICD-10-CM

## 2022-05-23 DIAGNOSIS — R110 Nausea: Secondary | ICD-10-CM

## 2022-05-23 MED ORDER — ONDANSETRON 4 MG PO TBDP: 4.0000 mg | ORAL_TABLET | Freq: Three times a day (TID) | ORAL | 0 refills | Status: AC | PRN

## 2022-05-23 NOTE — ED Triage Notes (Signed)
Pt states he started Monday with stomach ache and headache which resolved until Wednesday. Wednesday he states his started again with stomach ache and diarrhea every 10 mins since then the diarrhea has got some better but he is still having it. He has taken pepto without relief.

## 2022-05-23 NOTE — ED Provider Notes (Signed)
[place]   [id number] 05/23/22 Arrival Time: [id number]  ASSESSMENT & PLAN:  1. Diarrhea, unspecified type   2. Nausea without vomiting    No signs of dehydration. Tolerating PO fluid intake. No diarrhea within the past few hours. Hopefully will improve over the next day or two. If needed: Meds ordered this encounter  Medications   ondansetron (ZOFRAN-ODT) 4 MG disintegrating tablet    Sig: Take 1 tablet (4 mg total) by mouth every 8 (eight) hours as needed for nausea or vomiting.    Dispense:  15 tablet    Refill:  0   Declines lab work today. Discussed typical duration of symptoms for suspected viral GI illness. Will do his best to ensure adequate fluid intake in order to avoid dehydration. Will proceed to the Emergency Department for evaluation if unable to tolerate PO fluids regularly.  Otherwise he will f/u with his PCP or here if not showing improvement over the next 48-72 hours.  Reviewed expectations re: course of current medical issues. Questions answered. Outlined signs and symptoms indicating need for more acute intervention. Patient verbalized understanding. After Visit Summary given.   SUBJECTIVE: History from: patient.  Kyle Brock is a 22 y.o. male who presents with complaint of non-bilious, non-bloody intermittent nausea without vomiting; with non-bloody diarrhea. Onset yesterday. Abdominal discomfort: mild and cramping. Small amount of diarrhea upon waking today; none in past few hours. Mild fatigue. He denies fever. Appetite: normal. PO intake: normal. Ambulatory without assistance. Urinary symptoms: none. Sick contacts: none. Recent travel or camping: none. No tx PTA.   OBJECTIVE:  Vitals:   05/23/22 1049  BP: 120/75  Pulse: (!) 57  Resp: 18  Temp: 98.3 F (36.8 C)  TempSrc: Oral  SpO2: 97%    General appearance: alert; no distress Oropharynx: moist Lungs: clear to auscultation bilaterally; unlabored Heart: regular rate and  rhythm Abdomen: soft; non-distended; no significant abdominal tenderness; bowel sounds present; no masses or organomegaly; no guarding or rebound tenderness Back: no CVA tenderness Extremities: no edema; symmetrical with no gross deformities Skin: warm; dry Neurologic: normal gait Psychological: alert and cooperative; normal mood and affect    No Known Allergies                                             Past Medical History:  Diagnosis Date   HEART SOUNDS, ABNORMAL 12/28/2009   Qualifier: Diagnosis of  By: Annamary Carolin MD, Amber     SKIN RASH 12/28/2009   Qualifier: Diagnosis of  By: Martinique, Bonnie     Social History   Socioeconomic History   Marital status: Single    Spouse name: Not on file   Number of children: Not on file   Years of education: Not on file   Highest education level: Not on file  Occupational History   Not on file  Tobacco Use   Smoking status: Never   Smokeless tobacco: Never  Vaping Use   Vaping Use: Never used  Substance and Sexual Activity   Alcohol use: No   Drug use: Not Currently   Sexual activity: Not Currently  Other Topics Concern   Not on file  Social History Narrative   Not on file   Social Determinants of Health   Financial Resource Strain: Not on file  Food Insecurity: Not on file  Transportation Needs: Not on file  Physical  Activity: Not on file  Stress: Not on file  Social Connections: Not on file  Intimate Partner Violence: Not on file   Family History  Problem Relation Age of Onset   Kidney disease Father       Vanessa Kick, MD 05/23/22 1235

## 2022-05-23 NOTE — Discharge Instructions (Signed)
Please do your best to ensure adequate fluid intake in order to avoid dehydration. If you find that you are unable to tolerate drinking fluids regularly please proceed to the Emergency Department for evaluation.   

## 2022-08-24 DIAGNOSIS — U071 COVID-19: Secondary | ICD-10-CM

## 2022-08-24 HISTORY — DX: COVID-19: U07.1

## 2022-08-29 ENCOUNTER — Emergency Department (HOSPITAL_BASED_OUTPATIENT_CLINIC_OR_DEPARTMENT_OTHER)
Admission: EM | Admit: 2022-08-29 | Discharge: 2022-08-30 | Disposition: A | Discharge status: Home / Self Care | Payer: Self-pay | Attending: Emergency Medicine | Admitting: Emergency Medicine

## 2022-08-29 ENCOUNTER — Encounter (HOSPITAL_BASED_OUTPATIENT_CLINIC_OR_DEPARTMENT_OTHER): Payer: Self-pay

## 2022-08-29 ENCOUNTER — Other Ambulatory Visit: Payer: Self-pay

## 2022-08-29 ENCOUNTER — Emergency Department (HOSPITAL_BASED_OUTPATIENT_CLINIC_OR_DEPARTMENT_OTHER): Payer: Self-pay | Admitting: Radiology

## 2022-08-29 DIAGNOSIS — L03115 Cellulitis of right lower limb: Secondary | ICD-10-CM | POA: Insufficient documentation

## 2022-08-29 NOTE — ED Triage Notes (Signed)
Patient here POV from Home.  Endorses Right Fourth Toe Pain that began Monday. Since then has become more swollen and red. Beach Trip the weekend prior.  No Confirmed Fevers. No N/V/D. No Drainage.   NAD Noted during Triage. A&Ox4. Gcs 15. Ambulatory.

## 2022-08-30 ENCOUNTER — Telehealth (HOSPITAL_BASED_OUTPATIENT_CLINIC_OR_DEPARTMENT_OTHER): Payer: Self-pay | Admitting: Emergency Medicine

## 2022-08-30 MED ORDER — CEPHALEXIN 250 MG PO CAPS
500.0000 mg | ORAL_CAPSULE | Freq: Once | ORAL | Status: AC
Start: 2022-08-30 — End: 2022-08-30
  Administered 2022-08-30: 500 mg via ORAL
  Filled 2022-08-30: qty 2

## 2022-08-30 MED ORDER — CEPHALEXIN 500 MG PO CAPS: 500.0000 mg | ORAL_CAPSULE | Freq: Four times a day (QID) | ORAL | 0 refills | Status: AC

## 2022-08-30 NOTE — ED Provider Notes (Signed)
[place] [facility] AT [facility] Provider Note   CSN: [id number] Arrival date & time: 08/29/22  2143     History  Chief Complaint  Patient presents with   Foot Pain    Kyle Brock is a 22 y.o. male.  Patient is a 22 year old male with no significant past medical history.  Patient presents with complaints of redness, swelling, and pain to his right foot.  This has been worsening over the past few days.  He denies any specific injury or trauma, but does report being at the beach recently.  He was seen at urgent care, then referred here for further evaluation.  The history is provided by the patient.       Home Medications Prior to Admission medications   Medication Sig Start Date End Date Taking? Authorizing Provider  cetirizine (ZYRTEC ALLERGY) 10 MG tablet Take 1 tablet (10 mg total) by mouth daily. 04/16/22   Rising, Lurena Joiner, PA-C  dextromethorphan (DELSYM) 30 MG/5ML liquid Take 10 mLs (60 mg total) by mouth 2 (two) times daily. 04/16/22   Rising, Lurena Joiner, PA-C  ondansetron (ZOFRAN-ODT) 4 MG disintegrating tablet Take 1 tablet (4 mg total) by mouth every 8 (eight) hours as needed for nausea or vomiting. 05/23/22   Mardella Layman, MD      Allergies    Patient has no known allergies.    Review of Systems   Review of Systems  All other systems reviewed and are negative.   Physical Exam Updated Vital Signs BP (!) 161/100 (BP Location: Right Arm)   Pulse 83   Temp 98.5 F (36.9 C) (Oral)   Resp 18   Ht 5\' 8"  (1.727 m)   Wt 97.5 kg   SpO2 97%   BMI 32.69 kg/m  Physical Exam Vitals and nursing note reviewed.  Constitutional:      Appearance: Normal appearance.  HENT:     Head: Normocephalic.  Pulmonary:     Effort: Pulmonary effort is normal.  Musculoskeletal:     Comments: There is redness and erythema noted to the right fourth toe extending into the foot.  It is somewhat warm to the touch.  DP pulses are easily palpable and motor and  sensation are intact throughout the entire foot.  Skin:    General: Skin is warm and dry.  Neurological:     Mental Status: He is alert and oriented to person, place, and time.     ED Results / Procedures / Treatments   Labs (all labs ordered are listed, but only abnormal results are displayed) Labs Reviewed - No data to display  EKG None  Radiology DG Foot Complete Right  Result Date: 08/29/2022 CLINICAL DATA:  Fourth toe pain EXAM: RIGHT FOOT COMPLETE - 3+ VIEW COMPARISON:  None Available. FINDINGS: There is no evidence of fracture or dislocation. There is no evidence of arthropathy or other focal bone abnormality. Soft tissues are unremarkable. IMPRESSION: Negative. Electronically Signed   By: Darliss Cheney M.D.   On: 08/29/2022 22:14    Procedures Procedures    Medications Ordered in ED Medications  cephALEXin (KEFLEX) capsule 500 mg (has no administration in time range)    ED Course/ Medical Decision Making/ A&P  Patient sent from urgent care for further evaluation of redness, pain, and warmth extending from his right fourth toe into the dorsum of his foot.  This is very consistent with cellulitis and will be treated with Keflex.  I have considered but highly doubt DVT.  Final  Clinical Impression(s) / ED Diagnoses Final diagnoses:  None    Rx / DC Orders ED Discharge Orders     None         Geoffery Lyons, MD 08/30/22 0009

## 2022-08-30 NOTE — Discharge Instructions (Signed)
Begin taking Keflex as prescribed.  Perform warm soaks as frequently as possible for the next several days.  Return to the emergency department for worsening redness, high fever, worsening pain, or for other new and concerning symptoms.

## 2023-01-10 ENCOUNTER — Emergency Department (HOSPITAL_BASED_OUTPATIENT_CLINIC_OR_DEPARTMENT_OTHER): Payer: Self-pay | Admitting: Radiology

## 2023-01-10 ENCOUNTER — Emergency Department (HOSPITAL_BASED_OUTPATIENT_CLINIC_OR_DEPARTMENT_OTHER): Payer: Self-pay

## 2023-01-10 ENCOUNTER — Emergency Department (HOSPITAL_BASED_OUTPATIENT_CLINIC_OR_DEPARTMENT_OTHER)
Admission: EM | Admit: 2023-01-10 | Discharge: 2023-01-10 | Disposition: A | Discharge status: Home / Self Care | Payer: Self-pay | Attending: Emergency Medicine | Admitting: Emergency Medicine

## 2023-01-10 ENCOUNTER — Encounter (HOSPITAL_BASED_OUTPATIENT_CLINIC_OR_DEPARTMENT_OTHER): Payer: Self-pay

## 2023-01-10 ENCOUNTER — Other Ambulatory Visit: Payer: Self-pay

## 2023-01-10 DIAGNOSIS — R059 Cough, unspecified: Secondary | ICD-10-CM | POA: Insufficient documentation

## 2023-01-10 DIAGNOSIS — Z8616 Personal history of COVID-19: Secondary | ICD-10-CM | POA: Insufficient documentation

## 2023-01-10 DIAGNOSIS — Z1152 Encounter for screening for COVID-19: Secondary | ICD-10-CM | POA: Insufficient documentation

## 2023-01-10 DIAGNOSIS — J984 Other disorders of lung: Secondary | ICD-10-CM

## 2023-01-10 LAB — CBC
HCT: 45.2 % (ref 39.0–52.0)
Hemoglobin: 15.5 g/dL (ref 13.0–17.0)
MCH: 30.3 pg (ref 26.0–34.0)
MCHC: 34.3 g/dL (ref 30.0–36.0)
MCV: 88.5 fL (ref 80.0–100.0)
Platelets: 253 10*3/uL (ref 150–400)
RBC: 5.11 MIL/uL (ref 4.22–5.81)
RDW: 13.2 % (ref 11.5–15.5)
WBC: 10.6 10*3/uL — ABNORMAL HIGH (ref 4.0–10.5)
nRBC: 0 % (ref 0.0–0.2)

## 2023-01-10 LAB — RESP PANEL BY RT-PCR (RSV, FLU A&B, COVID)  RVPGX2
Influenza A by PCR: NEGATIVE
Influenza B by PCR: NEGATIVE
Resp Syncytial Virus by PCR: NEGATIVE
SARS Coronavirus 2 by RT PCR: NEGATIVE

## 2023-01-10 LAB — BASIC METABOLIC PANEL
Anion gap: 8 (ref 5–15)
BUN: 10 mg/dL (ref 6–20)
CO2: 27 mmol/L (ref 22–32)
Calcium: 9.5 mg/dL (ref 8.9–10.3)
Chloride: 103 mmol/L (ref 98–111)
Creatinine, Ser: 0.8 mg/dL (ref 0.61–1.24)
GFR, Estimated: >60 mL/min (ref >60)
Glucose, Bld: 89 mg/dL (ref 70–99)
Potassium: 3.7 mmol/L (ref 3.5–5.1)
Sodium: 138 mmol/L (ref 135–145)

## 2023-01-10 MED ORDER — FLUTICASONE-SALMETEROL 250-50 MCG/ACT IN AEPB: 1.0000 | INHALATION_SPRAY | Freq: Two times a day (BID) | RESPIRATORY_TRACT | 2 refills | Status: AC

## 2023-01-10 MED ORDER — ALBUTEROL SULFATE (2.5 MG/3ML) 0.083% IN NEBU
5.0000 mg | INHALATION_SOLUTION | Freq: Once | RESPIRATORY_TRACT | Status: AC
  Administered 2023-01-10: 5 mg via RESPIRATORY_TRACT
  Filled 2023-01-10: qty 6

## 2023-01-10 MED ORDER — MAGNESIUM SULFATE 2 GM/50ML IV SOLN
2.0000 g | INTRAVENOUS | Status: AC
  Administered 2023-01-10: 2 g via INTRAVENOUS
  Filled 2023-01-10: qty 50

## 2023-01-10 MED ORDER — METHYLPREDNISOLONE 4 MG PO TBPK
ORAL_TABLET | ORAL | 0 refills | Status: AC
Start: 2023-01-10 — End: ?

## 2023-01-10 MED ORDER — IPRATROPIUM-ALBUTEROL 0.5-2.5 (3) MG/3ML IN SOLN
3.0000 mL | Freq: Once | RESPIRATORY_TRACT | Status: AC
Start: 2023-01-10 — End: 2023-01-10
  Administered 2023-01-10: 3 mL via RESPIRATORY_TRACT
  Filled 2023-01-10: qty 3

## 2023-01-10 NOTE — ED Triage Notes (Signed)
In for eval of cough for 3-4 months. Non-productive cough. Post-tussive emesis. Has been seen by urgent care x2, was given inhaler, prednisone, and benzonatate without relief. Intermittent headache due to coughing. Sweating at times.

## 2023-01-10 NOTE — Discharge Instructions (Signed)
Get help right away if: You are getting worse and do not respond to treatment during an asthma attack. You are short of breath when at rest or when doing very little physical activity. You have difficulty eating, drinking, or talking. You have chest pain or tightness. You develop a fast heartbeat or palpitations. You have a bluish color to your lips or fingernails. You are light-headed or dizzy, or you faint. Your peak flow reading is less than 50% of your personal best. You feel too tired to breathe normally. These symptoms may be an emergency. Get help right away. Call 911. Do not wait to see if the symptoms will go away. Do not drive yourself to the hospital.

## 2023-01-10 NOTE — ED Provider Notes (Incomplete)
[place] [facility]PARTMENT AT [facility] Provider Note   CSN: [id number] Arrival date & time: 01/10/23  7829     History {Add pertinent medical, surgical, social history, OB history to HPI:1} Chief Complaint  Patient presents with   Cough    Kyle Brock is a 22 y.o. male resents emergency department chief complaint of 4 weeks of cough.  Patient states that he had COVID 4 months ago.  He states that about a week afterward he started to get better but then since that time has had progressively worsening cough.  He states that it is worse at night.  It is constant.  He coughs to the point of vomiting.  He has had some sweats at night. Patient denies hemoptysis or productive cough.  He has been given albuterol and steroids with improvement however soon as the steroids were off he is back to coughing to the point of vomiting.  She denies a history of GERD.  He is not on an ACE inhibitor.   Cough      Home Medications Prior to Admission medications   Medication Sig Start Date End Date Taking? Authorizing Provider  albuterol (VENTOLIN HFA) 108 (90 Base) MCG/ACT inhaler Inhale 2 puffs into the lungs every 6 (six) hours as needed for wheezing or shortness of breath.   Yes [provider]  benzonatate (TESSALON) 100 MG capsule Take 100 mg by mouth 2 (two) times daily as needed for cough.   Yes [provider]  predniSONE (DELTASONE) 20 MG tablet Take 20 mg by mouth daily with breakfast.   Yes [provider]  cephALEXin (KEFLEX) 500 MG capsule Take 1 capsule (500 mg total) by mouth 4 (four) times daily. 08/30/22   Geoffery Lyons, MD  cetirizine (ZYRTEC ALLERGY) 10 MG tablet Take 1 tablet (10 mg total) by mouth daily. 04/16/22   Rising, Lurena Joiner, PA-C  dextromethorphan (DELSYM) 30 MG/5ML liquid Take 10 mLs (60 mg total) by mouth 2 (two) times daily. 04/16/22   Rising, Lurena Joiner, PA-C  ondansetron (ZOFRAN-ODT) 4 MG disintegrating tablet Take 1 tablet (4 mg  total) by mouth every 8 (eight) hours as needed for nausea or vomiting. 05/23/22   Mardella Layman, MD      Allergies    Patient has no known allergies.    Review of Systems   Review of Systems  Respiratory:  Positive for cough.     Physical Exam Updated Vital Signs BP (!) 141/95   Pulse (!) 108   Temp 99.6 F (37.6 C)   Resp 18   Ht 5\' 8"  (1.727 m)   Wt 99.8 kg   SpO2 98%   BMI 33.45 kg/m  Physical Exam Vitals and nursing note reviewed.  Constitutional:      General: He is not in acute distress.    Appearance: He is well-developed. He is not diaphoretic.  HENT:     Head: Normocephalic and atraumatic.  Eyes:     General: No scleral icterus.    Conjunctiva/sclera: Conjunctivae normal.  Cardiovascular:     Rate and Rhythm: Normal rate and regular rhythm.     Heart sounds: Normal heart sounds.  Pulmonary:     Effort: Pulmonary effort is normal. No respiratory distress.     Breath sounds: Wheezing present.  Abdominal:     Palpations: Abdomen is soft.     Tenderness: There is no abdominal tenderness.  Musculoskeletal:     Cervical back: Normal range of motion and neck supple.  Skin:  General: Skin is warm and dry.  Neurological:     Mental Status: He is alert.  Psychiatric:        Behavior: Behavior normal.     ED Results / Procedures / Treatments   Labs (all labs ordered are listed, but only abnormal results are displayed) Labs Reviewed  RESP PANEL BY RT-PCR (RSV, FLU A&B, COVID)  [id number]  CBC  BASIC METABOLIC PANEL    EKG None  Radiology No results found.  Procedures Procedures  {Document cardiac monitor, telemetry assessment procedure when appropriate:1}  Medications Ordered in ED Medications  ipratropium-albuterol (DUONEB) 0.5-2.5 (3) MG/3ML nebulizer solution 3 mL (has no administration in time range)    ED Course/ Medical Decision Making/ A&P   {   Click here for ABCD2, HEART and other calculatorsREFRESH Note before signing :1}                               Medical Decision Making Amount and/or Complexity of Data Reviewed Labs: ordered. Radiology: ordered.  Risk Prescription drug management.   ***  {Document critical care time when appropriate:1} {Document review of labs and clinical decision tools ie heart score, Chads2Vasc2 etc:1}  {Document your independent review of radiology images, and any outside records:1} {Document your discussion with family members, caretakers, and with consultants:1} {Document social determinants of health affecting pt's care:1} {Document your decision making why or why not admission, treatments were needed:1} Final Clinical Impression(s) / ED Diagnoses Final diagnoses:  None    Rx / DC Orders ED Discharge Orders     None

## 2023-01-13 NOTE — Plan of Care (Signed)
[facility] Tonsillectomy/Adenoidectomy, Postoperative PEDS care plan entered in error.

## 2023-04-08 ENCOUNTER — Institutional Professional Consult (permissible substitution): Payer: Self-pay | Admitting: Internal Medicine

## 2023-05-14 IMAGING — CT CT RENAL STONE PROTOCOL
2 of 4 series · 16 of 46 positions shown, 18 images · non-contrast
Comparison: None.

CLINICAL DATA: Left flank pain. Patient reports symptoms for 1
week.

EXAM:
CT ABDOMEN AND PELVIS WITHOUT CONTRAST
TECHNIQUE: Multidetector CT imaging of the abdomen and pelvis was performed
following the standard protocol without IV contrast.

[Series 4: renal stone 5.0 · axial · 0.85mm/px · z∈[-1052,-607]mm · 13 of 97 slices shown, 15 images]
[im 4/97  soft-tissue]
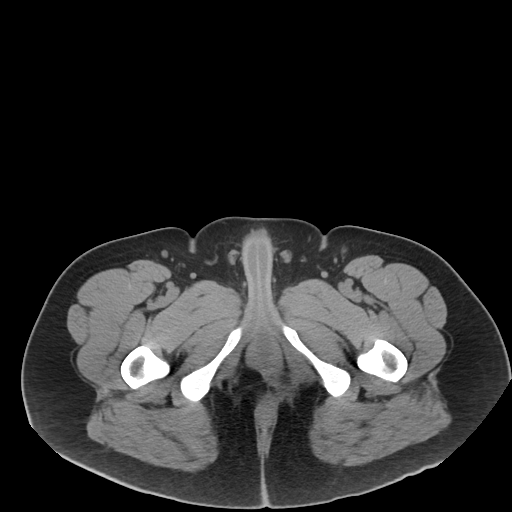
[im 4/97  bone]
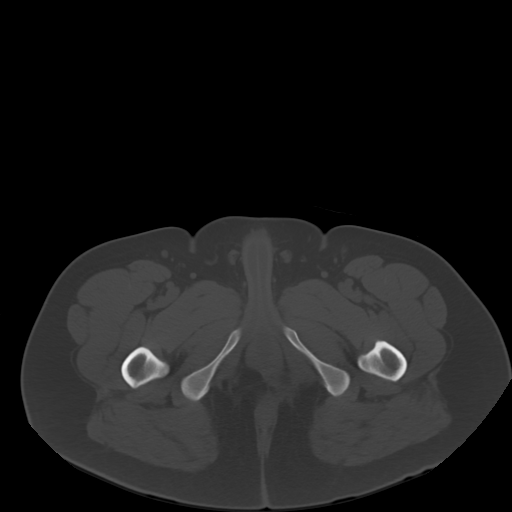
[im 12/97  soft-tissue]
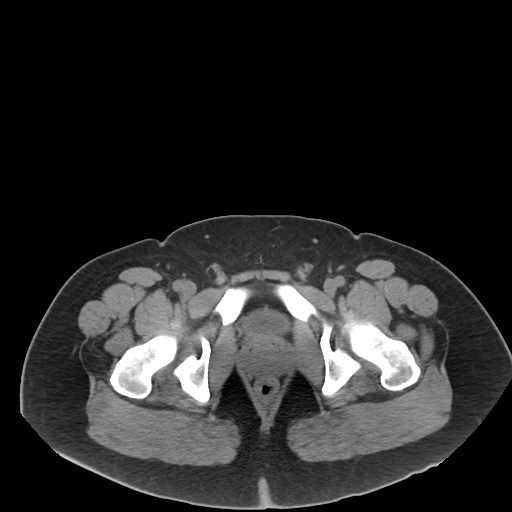
[im 19/97  soft-tissue]
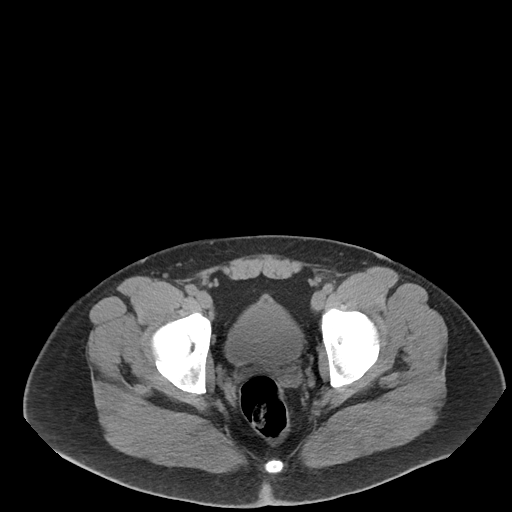
[im 26/97  soft-tissue]
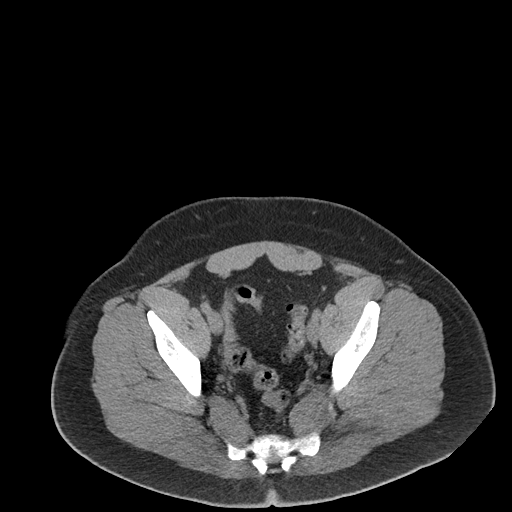
[im 34/97  soft-tissue]
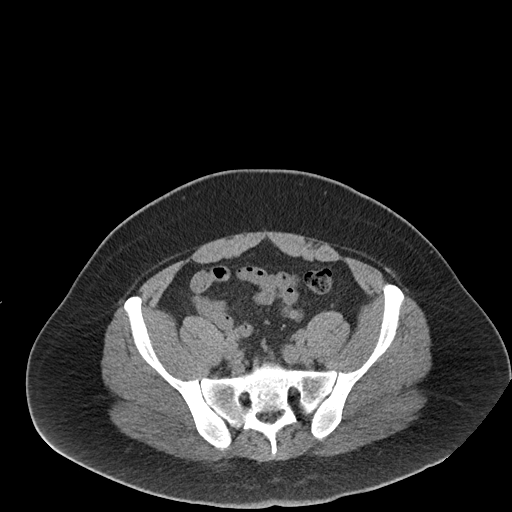
[im 41/97  soft-tissue]
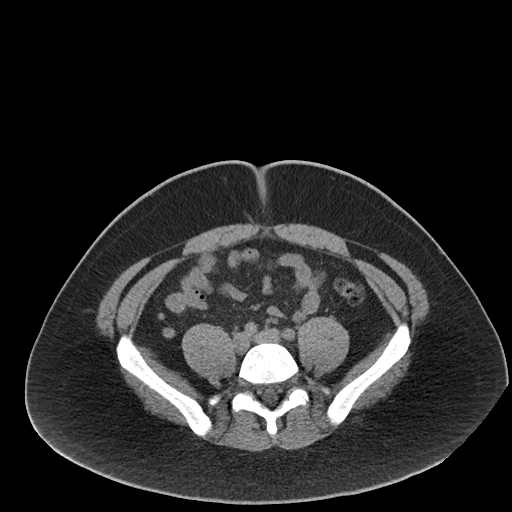
[im 49/97  soft-tissue]
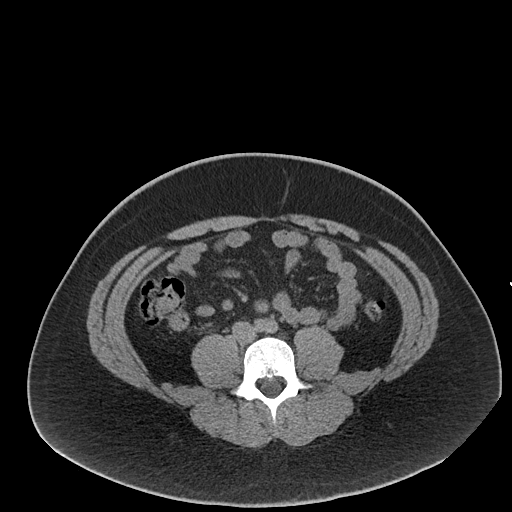
[im 56/97  soft-tissue]
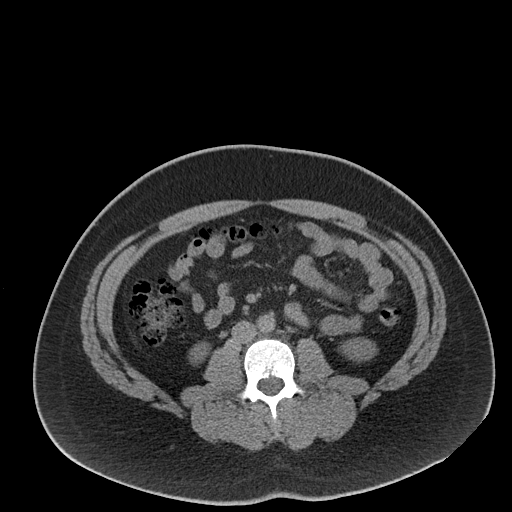
[im 63/97  soft-tissue]
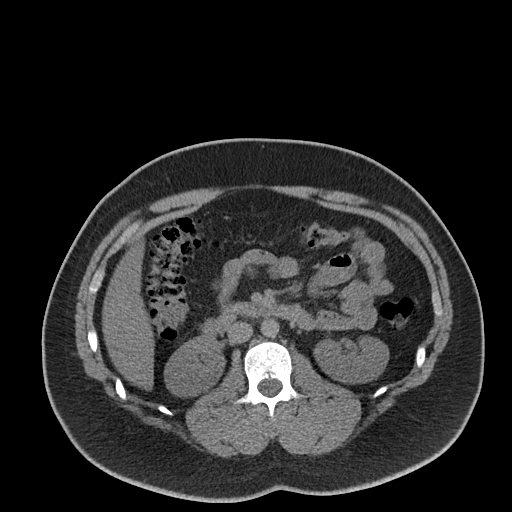
[im 63/97  bone]
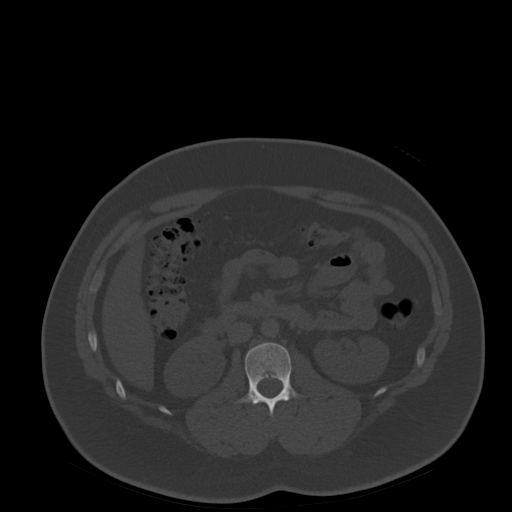
[im 71/97  soft-tissue]
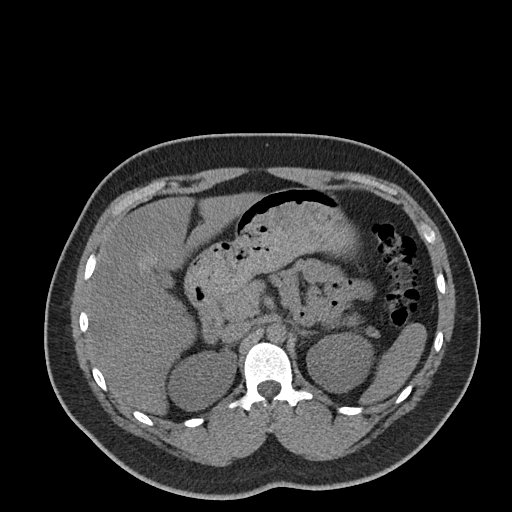
[im 78/97  soft-tissue]
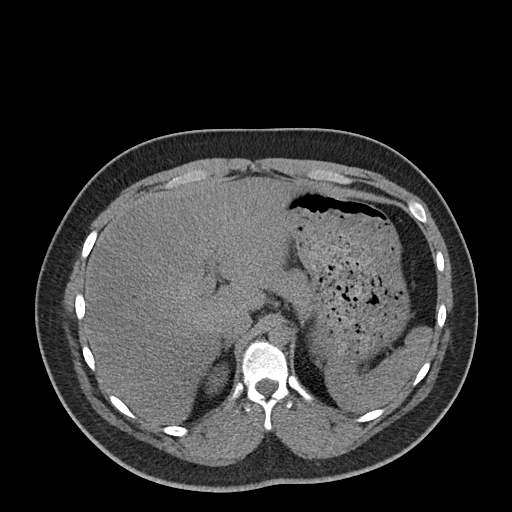
[im 85/97  soft-tissue]
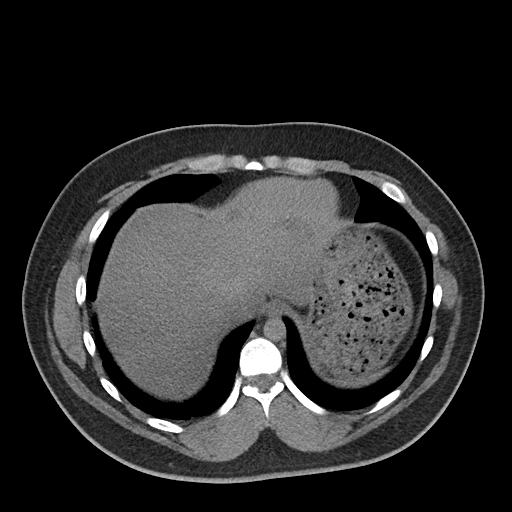
[im 93/97  soft-tissue]
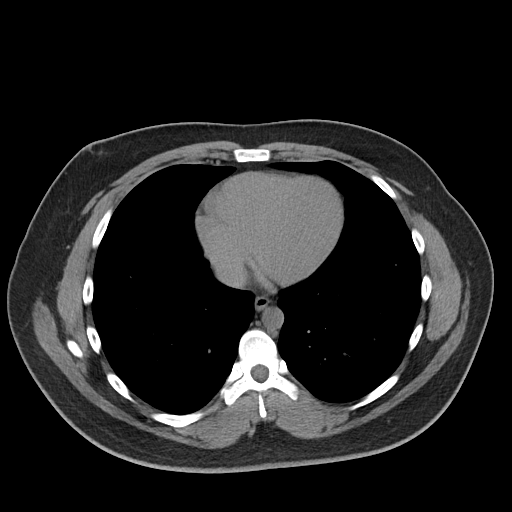

[Series 7: cor · coronal · 0.86mm/px · 3 of 156 slices shown]
[im 52/156  soft-tissue]
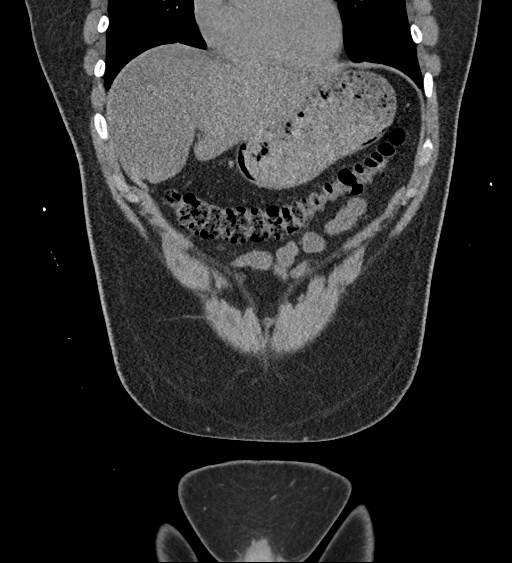
[im 69/156  soft-tissue]
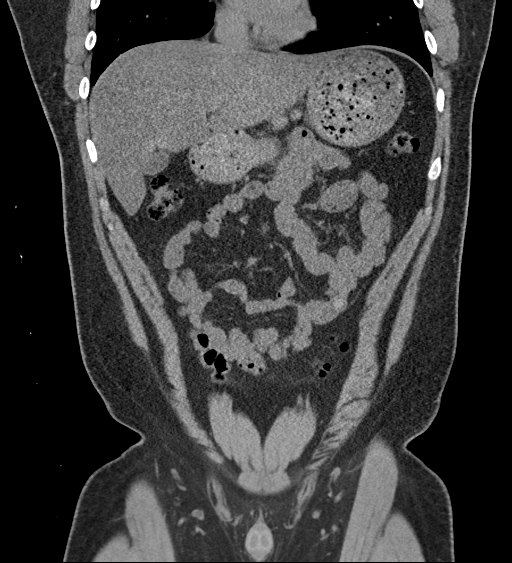
[im 87/156  soft-tissue]
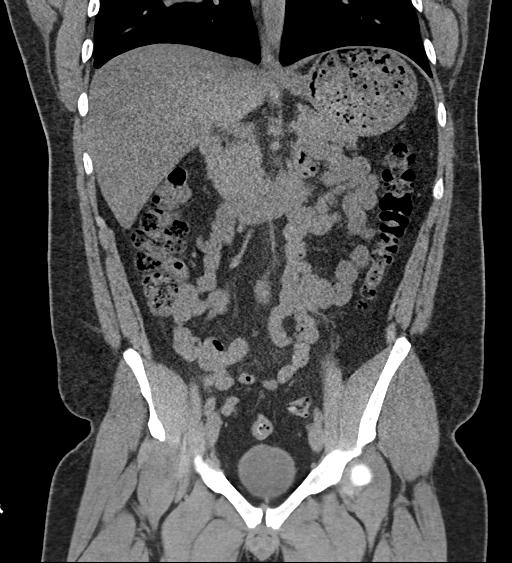

[16 of 46 positions shown; findings below may reference images not displayed]

FINDINGS: Lower chest: Minor subsegmental atelectasis in the right lower lobe.
No confluent consolidation or pleural effusion.

Hepatobiliary: Diffusely decreased hepatic density typical of
steatosis. There is focal fatty sparing adjacent to the gallbladder
fossa. No focal lesion is seen on this unenhanced exam. Partially
distended gallbladder without calcified gallstone or pericholecystic
inflammation. There is no biliary dilatation.

Pancreas: Unremarkable. No pancreatic ductal dilatation or
surrounding inflammatory changes.

Spleen: Normal in size without focal abnormality.

Adrenals/Urinary Tract: Normal adrenal glands. No hydronephrosis or
renal calculi. No perinephric edema. The left kidney is slightly
malrotated and ptotic, with the renal hilum directed anteriorly. No
ureteral or bladder stones. No focal renal lesions are seen. No
bladder wall thickening.

Stomach/Bowel: Ingested material within the stomach. No small bowel
obstruction or inflammation. Normal appendix. Small volume of stool
throughout the colon. No colonic inflammation.

Vascular/Lymphatic: Normal caliber abdominal aorta. No portal venous
or mesenteric gas. There are no enlarged lymph nodes in the abdomen
or pelvis.

Reproductive: Prostate is unremarkable.

Other: No free air, free fluid, or intra-abdominal fluid collection.
There is a small fat containing umbilical hernia.

Musculoskeletal: There are no acute or suspicious osseous
abnormalities. Vague area of sclerosis in the left femoral head may
represent avascular necrosis, only faintly visualized.
IMPRESSION: 1. No renal stones or obstructive uropathy. No acute abnormality in
the abdomen/pelvis.
2. Hepatic steatosis.
3. Small fat containing umbilical hernia.
4. Vague area of sclerosis in the left femoral head may represent
avascular necrosis, only faintly visualized.

## 2023-06-10 ENCOUNTER — Institutional Professional Consult (permissible substitution): Payer: Self-pay | Admitting: Internal Medicine

## 2023-07-14 IMAGING — DX DG CHEST 1V PORT
1 series · 1 of 1 positions shown · non-contrast
Comparison: None.

CLINICAL DATA: Shortness of breath

EXAM:
PORTABLE CHEST 1 VIEW

[chest]
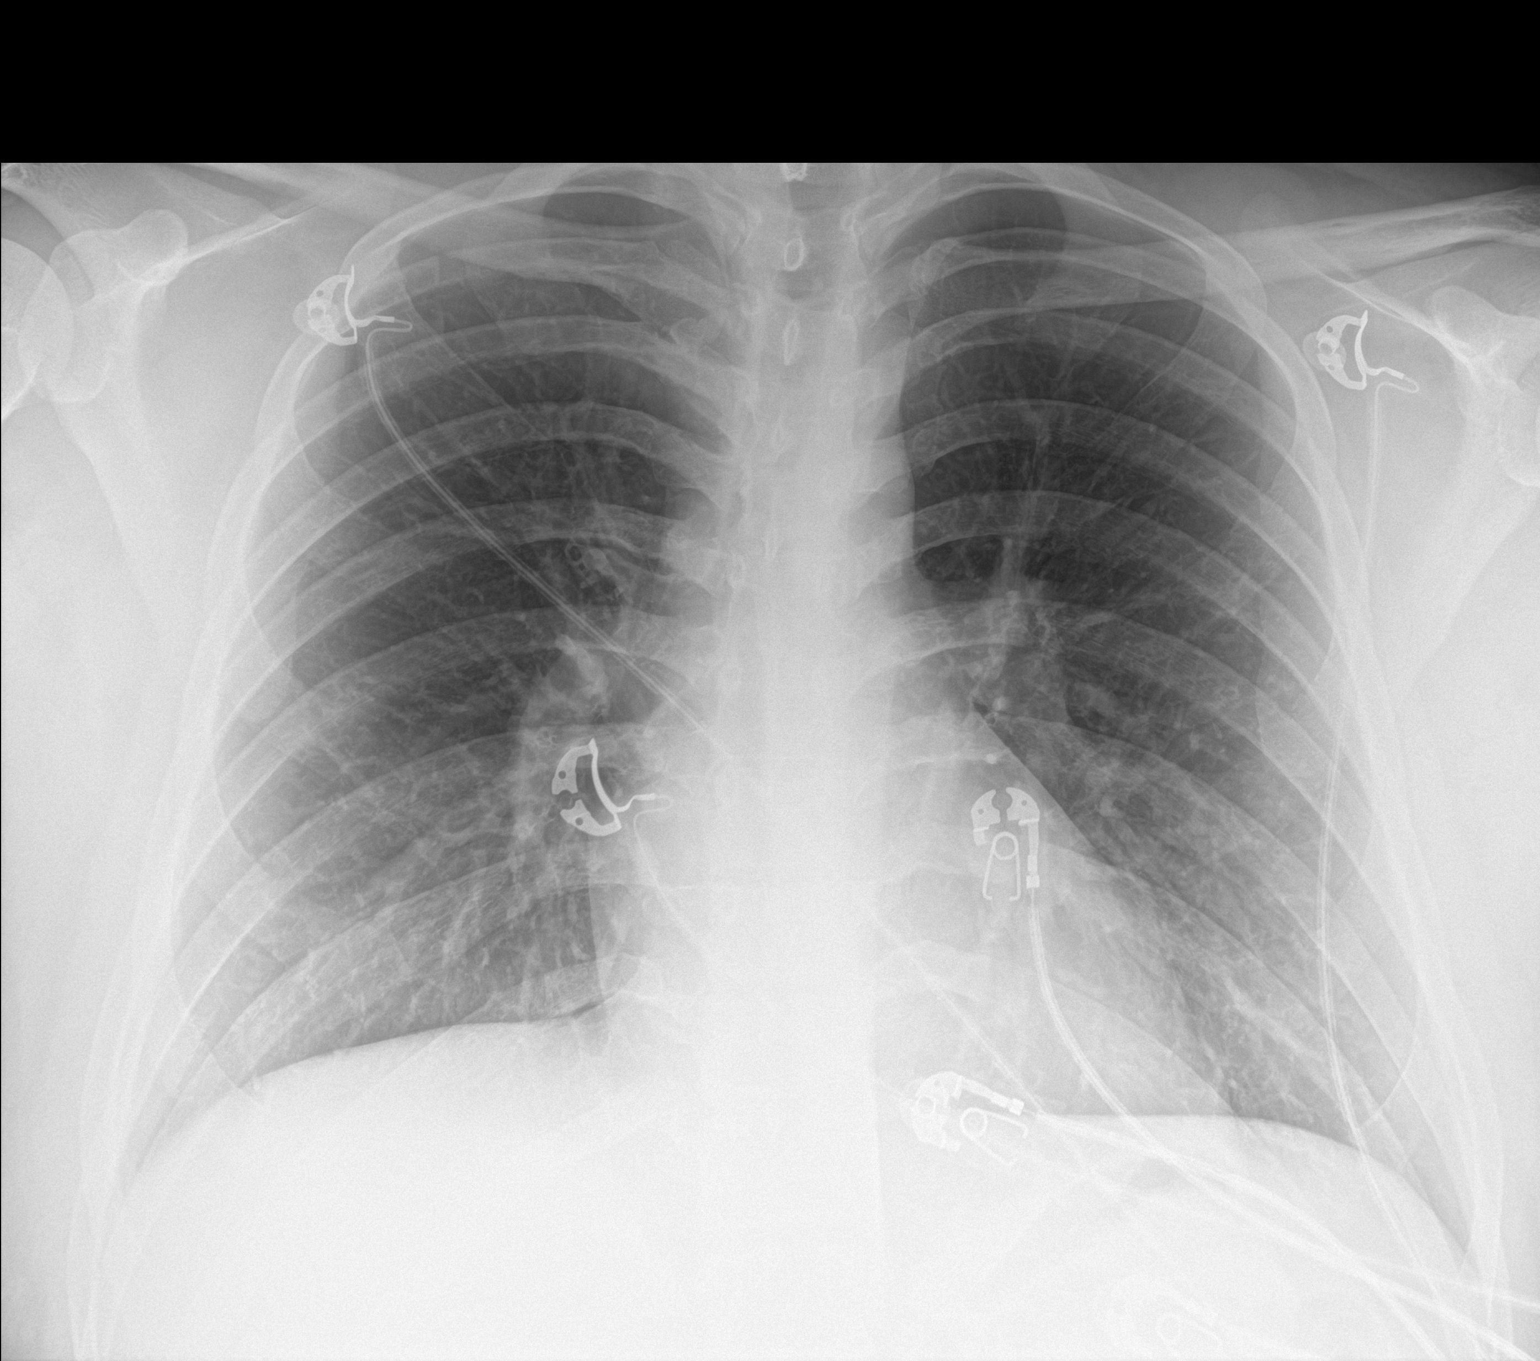

[1 of 1 positions shown; findings below may reference images not displayed]

FINDINGS: The heart size and mediastinal contours are within normal limits.
Both lungs are clear. The visualized skeletal structures are
unremarkable.
IMPRESSION: No active disease.
